# Patient Record
Sex: Female | Born: 1965 | Race: Black or African American | Hispanic: No | Marital: Single | Smoking: Never smoker
Health system: Southern US, Community
[De-identification: ages and names within clinical notes are randomized; demographics above are authoritative.]

## PROBLEM LIST (undated history)

## (undated) DIAGNOSIS — K219 Gastro-esophageal reflux disease without esophagitis: Secondary | ICD-10-CM

## (undated) HISTORY — PX: EYE SURGERY: SHX253

---

## 2005-12-07 ENCOUNTER — Ambulatory Visit: Admission: RE | Admit: 2005-12-07 | Discharge: 2005-12-07 | Payer: Self-pay | Admitting: Obstetrics and Gynecology

## 2006-12-11 ENCOUNTER — Ambulatory Visit (HOSPITAL_COMMUNITY): Admission: RE | Admit: 2006-12-11 | Discharge: 2006-12-11 | Payer: Self-pay | Admitting: Family Medicine

## 2009-01-05 ENCOUNTER — Ambulatory Visit (HOSPITAL_COMMUNITY): Admission: RE | Admit: 2009-01-05 | Discharge: 2009-01-05 | Payer: Self-pay | Admitting: Family Medicine

## 2010-01-11 ENCOUNTER — Ambulatory Visit (HOSPITAL_COMMUNITY): Admission: RE | Admit: 2010-01-11 | Discharge: 2010-01-11 | Payer: Self-pay | Admitting: Family Medicine

## 2011-01-17 ENCOUNTER — Other Ambulatory Visit (HOSPITAL_COMMUNITY): Payer: Self-pay | Admitting: Family Medicine

## 2011-01-17 DIAGNOSIS — Z1231 Encounter for screening mammogram for malignant neoplasm of breast: Secondary | ICD-10-CM

## 2011-01-24 ENCOUNTER — Ambulatory Visit (HOSPITAL_COMMUNITY)
Admission: RE | Admit: 2011-01-24 | Discharge: 2011-01-24 | Disposition: A | Discharge status: Home / Self Care | Payer: Self-pay | Source: Ambulatory Visit | Attending: Family Medicine | Admitting: Family Medicine

## 2011-01-24 DIAGNOSIS — Z1231 Encounter for screening mammogram for malignant neoplasm of breast: Secondary | ICD-10-CM

## 2011-12-12 ENCOUNTER — Other Ambulatory Visit (HOSPITAL_COMMUNITY): Payer: Self-pay | Admitting: *Deleted

## 2011-12-12 DIAGNOSIS — Z1231 Encounter for screening mammogram for malignant neoplasm of breast: Secondary | ICD-10-CM

## 2012-01-23 ENCOUNTER — Other Ambulatory Visit (HOSPITAL_COMMUNITY): Payer: Self-pay | Admitting: *Deleted

## 2012-01-23 DIAGNOSIS — Z1231 Encounter for screening mammogram for malignant neoplasm of breast: Secondary | ICD-10-CM

## 2012-01-26 ENCOUNTER — Ambulatory Visit (HOSPITAL_COMMUNITY)
Admission: RE | Admit: 2012-01-26 | Discharge: 2012-01-26 | Disposition: A | Discharge status: Home / Self Care | Payer: Self-pay | Source: Ambulatory Visit | Attending: *Deleted | Admitting: *Deleted

## 2012-01-26 DIAGNOSIS — Z1231 Encounter for screening mammogram for malignant neoplasm of breast: Secondary | ICD-10-CM

## 2012-01-30 ENCOUNTER — Ambulatory Visit (HOSPITAL_COMMUNITY): Payer: Self-pay

## 2012-02-09 ENCOUNTER — Encounter (HOSPITAL_COMMUNITY): Payer: Self-pay

## 2012-12-24 ENCOUNTER — Other Ambulatory Visit (HOSPITAL_COMMUNITY): Payer: Self-pay | Admitting: Nurse Practitioner

## 2012-12-24 DIAGNOSIS — Z139 Encounter for screening, unspecified: Secondary | ICD-10-CM

## 2012-12-31 ENCOUNTER — Encounter (INDEPENDENT_AMBULATORY_CARE_PROVIDER_SITE_OTHER): Payer: Managed Care, Other (non HMO) | Admitting: Ophthalmology

## 2012-12-31 DIAGNOSIS — H43819 Vitreous degeneration, unspecified eye: Secondary | ICD-10-CM

## 2012-12-31 DIAGNOSIS — H33309 Unspecified retinal break, unspecified eye: Secondary | ICD-10-CM

## 2012-12-31 DIAGNOSIS — H2510 Age-related nuclear cataract, unspecified eye: Secondary | ICD-10-CM

## 2013-01-09 ENCOUNTER — Ambulatory Visit (INDEPENDENT_AMBULATORY_CARE_PROVIDER_SITE_OTHER): Payer: Managed Care, Other (non HMO) | Admitting: Ophthalmology

## 2013-01-09 DIAGNOSIS — H33309 Unspecified retinal break, unspecified eye: Secondary | ICD-10-CM

## 2013-01-21 ENCOUNTER — Encounter (INDEPENDENT_AMBULATORY_CARE_PROVIDER_SITE_OTHER): Payer: Managed Care, Other (non HMO) | Admitting: Ophthalmology

## 2013-01-21 DIAGNOSIS — H33309 Unspecified retinal break, unspecified eye: Secondary | ICD-10-CM

## 2013-01-28 ENCOUNTER — Ambulatory Visit (HOSPITAL_COMMUNITY)
Admission: RE | Admit: 2013-01-28 | Discharge: 2013-01-28 | Disposition: A | Discharge status: Home / Self Care | Payer: Managed Care, Other (non HMO) | Source: Ambulatory Visit | Attending: Nurse Practitioner | Admitting: Nurse Practitioner

## 2013-01-28 DIAGNOSIS — Z1231 Encounter for screening mammogram for malignant neoplasm of breast: Secondary | ICD-10-CM | POA: Insufficient documentation

## 2013-01-28 DIAGNOSIS — Z139 Encounter for screening, unspecified: Secondary | ICD-10-CM

## 2013-03-04 ENCOUNTER — Emergency Department (HOSPITAL_COMMUNITY): Payer: Managed Care, Other (non HMO)

## 2013-03-04 ENCOUNTER — Emergency Department (HOSPITAL_COMMUNITY)
Admission: EM | Admit: 2013-03-04 | Discharge: 2013-03-04 | Disposition: A | Discharge status: Home / Self Care | Payer: Managed Care, Other (non HMO) | Attending: Emergency Medicine | Admitting: Emergency Medicine

## 2013-03-04 ENCOUNTER — Encounter (HOSPITAL_COMMUNITY): Payer: Self-pay | Admitting: Emergency Medicine

## 2013-03-04 DIAGNOSIS — Z3202 Encounter for pregnancy test, result negative: Secondary | ICD-10-CM | POA: Insufficient documentation

## 2013-03-04 DIAGNOSIS — M62838 Other muscle spasm: Secondary | ICD-10-CM | POA: Insufficient documentation

## 2013-03-04 DIAGNOSIS — R1011 Right upper quadrant pain: Secondary | ICD-10-CM | POA: Insufficient documentation

## 2013-03-04 DIAGNOSIS — R071 Chest pain on breathing: Secondary | ICD-10-CM | POA: Insufficient documentation

## 2013-03-04 DIAGNOSIS — R109 Unspecified abdominal pain: Secondary | ICD-10-CM | POA: Insufficient documentation

## 2013-03-04 LAB — COMPREHENSIVE METABOLIC PANEL
ALT: 10 U/L (ref 0–35)
AST: 14 U/L (ref 0–37)
Albumin: 3.7 g/dL (ref 3.5–5.2)
Alkaline Phosphatase: 79 U/L (ref 39–117)
BILIRUBIN TOTAL: 0.2 mg/dL — AB (ref 0.3–1.2)
BUN: 9 mg/dL (ref 6–23)
CALCIUM: 9.3 mg/dL (ref 8.4–10.5)
CHLORIDE: 102 meq/L (ref 96–112)
CO2: 24 meq/L (ref 19–32)
CREATININE: 0.69 mg/dL (ref 0.50–1.10)
GLUCOSE: 80 mg/dL (ref 70–99)
Potassium: 3.8 mEq/L (ref 3.7–5.3)
Sodium: 139 mEq/L (ref 137–147)
Total Protein: 7.9 g/dL (ref 6.0–8.3)

## 2013-03-04 LAB — URINALYSIS, ROUTINE W REFLEX MICROSCOPIC
Bilirubin Urine: NEGATIVE
GLUCOSE, UA: NEGATIVE mg/dL
HGB URINE DIPSTICK: NEGATIVE
Ketones, ur: NEGATIVE mg/dL
LEUKOCYTES UA: NEGATIVE
Nitrite: NEGATIVE
PH: 6 (ref 5.0–8.0)
PROTEIN: NEGATIVE mg/dL
Specific Gravity, Urine: 1.01 (ref 1.005–1.030)
Urobilinogen, UA: 0.2 mg/dL (ref 0.0–1.0)

## 2013-03-04 LAB — CBC WITH DIFFERENTIAL/PLATELET
BASOS ABS: 0 10*3/uL (ref 0.0–0.1)
Basophils Relative: 0 % (ref 0–1)
EOS PCT: 0 % (ref 0–5)
Eosinophils Absolute: 0 10*3/uL (ref 0.0–0.7)
HEMATOCRIT: 38.5 % (ref 36.0–46.0)
HEMOGLOBIN: 12.5 g/dL (ref 12.0–15.0)
LYMPHS ABS: 2.2 10*3/uL (ref 0.7–4.0)
LYMPHS PCT: 22 % (ref 12–46)
MCH: 31.1 pg (ref 26.0–34.0)
MCHC: 32.5 g/dL (ref 30.0–36.0)
MCV: 95.8 fL (ref 78.0–100.0)
MONO ABS: 0.5 10*3/uL (ref 0.1–1.0)
MONOS PCT: 5 % (ref 3–12)
NEUTROS ABS: 7.3 10*3/uL (ref 1.7–7.7)
Neutrophils Relative %: 72 % (ref 43–77)
Platelets: 319 10*3/uL (ref 150–400)
RBC: 4.02 MIL/uL (ref 3.87–5.11)
RDW: 11.8 % (ref 11.5–15.5)
WBC: 10.1 10*3/uL (ref 4.0–10.5)

## 2013-03-04 LAB — PREGNANCY, URINE: PREG TEST UR: NEGATIVE

## 2013-03-04 LAB — LIPASE, BLOOD: LIPASE: 26 U/L (ref 11–59)

## 2013-03-04 MED ORDER — CYCLOBENZAPRINE HCL 5 MG PO TABS: 5.0000 mg | ORAL_TABLET | Freq: Three times a day (TID) | ORAL | Status: DC | PRN

## 2013-03-04 MED ORDER — HYDROCODONE-ACETAMINOPHEN 5-325 MG PO TABS: 1.0000 | ORAL_TABLET | ORAL | Status: DC | PRN

## 2013-03-04 MED ORDER — IBUPROFEN 800 MG PO TABS: 800.0000 mg | ORAL_TABLET | Freq: Three times a day (TID) | ORAL | Status: DC | PRN

## 2013-03-04 MED ORDER — KETOROLAC TROMETHAMINE 30 MG/ML IJ SOLN
30.0000 mg | Freq: Once | INTRAMUSCULAR | Status: AC
  Administered 2013-03-04: 30 mg via INTRAVENOUS
  Filled 2013-03-04: qty 1

## 2013-03-04 NOTE — Discharge Instructions (Signed)
Flank Pain Flank pain refers to pain that is located on the side of the body between the upper abdomen and the back. The pain may occur over a short period of time (acute) or may be long-term or reoccurring (chronic). It may be mild or severe. Flank pain can be caused by many things. CAUSES  Some of the more common causes of flank pain include:  Muscle strains.   Muscle spasms.   A disease of your spine (vertebral disk disease).   A lung infection (pneumonia).   Fluid around your lungs (pulmonary edema).   A kidney infection.   Kidney stones.   A very painful skin rash caused by the chickenpox virus (shingles).   Gallbladder disease.  [place] care will depend on the cause of your pain. In general,  Rest as directed by your caregiver.  Drink enough fluids to keep your urine clear or pale yellow.  Only take over-the-counter or prescription medicines as directed by your caregiver. Some medicines may help relieve the pain.  Tell your caregiver about any changes in your pain.  Follow up with your caregiver as directed. SEEK IMMEDIATE MEDICAL CARE IF:   Your pain is not controlled with medicine.   You have new or worsening symptoms.  Your pain increases.   You have abdominal pain.   You have shortness of breath.   You have persistent nausea or vomiting.   You have swelling in your abdomen.   You feel faint or pass out.   You have blood in your urine.  You have a fever or persistent symptoms for more than 2 3 days.  You have a fever and your symptoms suddenly get worse. MAKE SURE YOU:   Understand these instructions.  Will watch your condition.  Will get help right away if you are not doing well or get worse. Document Released: 03/24/2005 Document Revised: 10/26/2011 Document Reviewed: 09/15/2011 [facility] Patient Information 2014 [place].    Muscle Cramps and Spasms Muscle cramps and spasms occur when a muscle  or muscles tighten and you have no control over this tightening (involuntary muscle contraction). They are a common problem and can develop in any muscle. The most common place is in the calf muscles of the leg. Both muscle cramps and muscle spasms are involuntary muscle contractions, but they also have differences:   Muscle cramps are sporadic and painful. They may last a few seconds to a quarter of an hour. Muscle cramps are often more forceful and last longer than muscle spasms.  Muscle spasms may or may not be painful. They may also last just a few seconds or much longer. CAUSES  It is uncommon for cramps or spasms to be due to a serious underlying problem. In many cases, the cause of cramps or spasms is unknown. Some common causes are:   Overexertion.   Overuse from repetitive motions (doing the same thing over and over).   Remaining in a certain position for a long period of time.   Improper preparation, form, or technique while performing a sport or activity.   Dehydration.   Injury.   Side effects of some medicines.   Abnormally low levels of the salts and ions in your blood (electrolytes), especially potassium and calcium. This could happen if you are taking water pills (diuretics) or you are pregnant.  Some underlying medical problems can make it more likely to develop cramps or spasms. These include, but are not limited to:   Diabetes.  Parkinson disease.   Hormone disorders, such as thyroid problems.   Alcohol abuse.   Diseases specific to muscles, joints, and bones.   Blood vessel disease where not enough blood is getting to the muscles.  HOME CARE INSTRUCTIONS   Stay well hydrated. Drink enough water and fluids to keep your urine clear or pale yellow.  It may be helpful to massage, stretch, and relax the affected muscle.  For tight or tense muscles, use a warm towel, heating pad, or hot shower water directed to the affected area.  If you are  sore or have pain after a cramp or spasm, applying ice to the affected area may relieve discomfort.  Put ice in a plastic bag.  Place a towel between your skin and the bag.  Leave the ice on for 15-20 minutes, 03-04 times a day.  Medicines used to treat a known cause of cramps or spasms may help reduce their frequency or severity. Only take over-the-counter or prescription medicines as directed by your caregiver. SEEK MEDICAL CARE IF:  Your cramps or spasms get more severe, more frequent, or do not improve over time.  MAKE SURE YOU:   Understand these instructions.  Will watch your condition.  Will get help right away if you are not doing well or get worse. Document Released: 07/23/2001 Document Revised: 05/28/2012 Document Reviewed: 01/18/2012 [facility] Patient Information 2014 [place], [place].    Your labs today were normal. Your urine showed no sign of infection or blood to suggest kidney stone. Your chest x-ray showed no pneumonia or fluid. You have no risk factors for blood clots in your lungs. Your ultrasound showed a normal gallbladder and liver without stones or signs of infection. There is no rash in your skin to suggest other causes such as shingles. This may be musculoskeletal strain, spasm. We recommend that you take anti-inflammatories such as ibuprofen as needed for pain. If this is not enough, you may take Vicodin. If you develop any worsening symptoms, shortness of breath, vomiting and cannot keep down fluids, fever, any other symptoms concerning to you, please return to the emergency department.

## 2013-03-04 NOTE — ED Provider Notes (Signed)
TIME SEEN: 11:56 AM   CHIEF COMPLAINT: flank pain  HPI: HPI Comments: Lori Ward is a 48 y.o. female with no significant past medical history who presents to the Emergency Department complaining of sudden onset sharp right sided flank pain that started this morning at 10 am while washing clothes in her laundry room.  No aggravating or alleviating factors. No radiation. She describes the pain as moderate. She has not had any prior occurences or injuries to her back. Pt denies dysuria, hematuria, nausea, vomiting, diarrhea, numbness or tingling, weakness in extremities, bowel or bladder incontinence, fever, chills, or vaginal discharge or bleeding. No prior history of PE or DVT, recent prolonged immobilization such as long flight or hospitalization, recent fracture, surgery, trauma, exogenous hormone use or tobacco use. Pt's LNMP was 3 weeks ago.   ROS: See HPI Constitutional: no fever  Eyes: no drainage  ENT: no runny nose   Cardiovascular:  no chest pain  Resp: no SOB  GI: no vomiting GU: no dysuria Integumentary: no rash  Allergy: no hives  Musculoskeletal: no leg swelling  Neurological: no slurred speech ROS otherwise negative  PAST MEDICAL HISTORY/PAST SURGICAL HISTORY:  History reviewed. No pertinent past medical history.  MEDICATIONS:  Prior to Admission medications   Not on File    ALLERGIES:  No Known Allergies  SOCIAL HISTORY:  History  Substance Use Topics  . Smoking status: Never Smoker   . Smokeless tobacco: Not on file  . Alcohol Use: No    FAMILY HISTORY: No family history on file.  EXAM: BP 126/70  Pulse 78  Temp(Src) 97.2 F (36.2 C) (Oral)  Resp 20  Ht 5\' 7"  (1.702 m)  Wt 170 lb (77.111 kg)  BMI 26.62 kg/m2  SpO2 100%  LMP 02/11/2013 CONSTITUTIONAL: Alert and oriented and responds appropriately to questions. Well-appearing; well-nourished HEAD: Normocephalic EYES: Conjunctivae clear, PERRL ENT: normal nose; no rhinorrhea; moist mucous  membranes; pharynx without lesions noted NECK: Supple, no meningismus, no LAD  CARD: RRR; S1 and S2 appreciated; no murmurs, no clicks, no rubs, no gallops RESP: Normal chest excursion without splinting or tachypnea; breath sounds clear and equal bilaterally; no wheezes, no rhonchi, no rales, mild tenderness to palpation over the right lateral chest wall without skin changes or crepitus or ecchymosis or bony deformity ABD/GI: Normal bowel sounds; non-distended; soft, tender to palpation in RUQ but negative Murphy's sign, no rebound, no guarding BACK:  The back appears normal and is non-tender to palpation, there is no CVA tenderness EXT: Normal ROM in all joints; non-tender to palpation; no edema; normal capillary refill; no cyanosis    SKIN: Normal color for age and race; warm NEURO: Moves all extremities equally PSYCH: The patient's mood and manner are appropriate. Grooming and personal hygiene are appropriate.  MEDICAL DECISION MAKING: Patient here with right-sided flank pain. She is having some reproducible pain over the right chest wall but is also very tender to palpation in her right upper quadrant but has a negative Murphy sign. Given sudden onset, concern for possible kidney stone as well as cholecystitis versus cholelithiasis. Given patient has no risk factors for pulmonary embolus, I do not feel this is likely. She is also describing mild cough. Will obtain labs, urine to evaluate for possible pyelonephritis and kidney stone, right upper quad ultrasound to evaluate patient's gallbladder, chest x-ray to evaluate for possible pneumonia. Will give Toradol and reassess.  ED PROGRESS: Pt reports feeling much better after Toradol. Patient's labs, urine, abdominal ultrasound and  chest x-ray are unremarkable. Suspect her symptoms may be related to musculoskeletal pain. No concern for any life-threatening illness at this time. We'll discharge home with pain medication, anti-inflammatories, muscle  relaxers. Given strict return precautions. Patient has PCP for followup. Patient verbalizes understanding is comfortable with plan.     [place], DO 03/04/13 1427

## 2013-03-04 NOTE — ED Notes (Signed)
Pt reports right flank pain for 1 hour pta, denies any fever, chills, nausea or vomiting, no fever, no vaginal discharge, no urinary s/s

## 2013-06-03 ENCOUNTER — Ambulatory Visit (INDEPENDENT_AMBULATORY_CARE_PROVIDER_SITE_OTHER): Payer: Managed Care, Other (non HMO) | Admitting: Ophthalmology

## 2013-06-03 DIAGNOSIS — H33309 Unspecified retinal break, unspecified eye: Secondary | ICD-10-CM

## 2013-06-03 DIAGNOSIS — H33009 Unspecified retinal detachment with retinal break, unspecified eye: Secondary | ICD-10-CM

## 2013-06-03 DIAGNOSIS — H2510 Age-related nuclear cataract, unspecified eye: Secondary | ICD-10-CM

## 2013-06-03 DIAGNOSIS — H31009 Unspecified chorioretinal scars, unspecified eye: Secondary | ICD-10-CM

## 2013-06-03 DIAGNOSIS — H43819 Vitreous degeneration, unspecified eye: Secondary | ICD-10-CM

## 2013-12-23 ENCOUNTER — Other Ambulatory Visit (HOSPITAL_COMMUNITY): Payer: Self-pay | Admitting: *Deleted

## 2013-12-23 DIAGNOSIS — Z1231 Encounter for screening mammogram for malignant neoplasm of breast: Secondary | ICD-10-CM

## 2014-02-03 ENCOUNTER — Ambulatory Visit (HOSPITAL_COMMUNITY)
Admission: RE | Admit: 2014-02-03 | Discharge: 2014-02-03 | Disposition: A | Discharge status: Home / Self Care | Payer: Managed Care, Other (non HMO) | Source: Ambulatory Visit | Attending: *Deleted | Admitting: *Deleted

## 2014-02-03 DIAGNOSIS — Z1231 Encounter for screening mammogram for malignant neoplasm of breast: Secondary | ICD-10-CM | POA: Insufficient documentation

## 2015-03-16 ENCOUNTER — Other Ambulatory Visit (HOSPITAL_COMMUNITY): Payer: Self-pay | Admitting: *Deleted

## 2015-03-16 DIAGNOSIS — Z1231 Encounter for screening mammogram for malignant neoplasm of breast: Secondary | ICD-10-CM

## 2015-03-20 ENCOUNTER — Ambulatory Visit (HOSPITAL_COMMUNITY)
Admission: RE | Admit: 2015-03-20 | Discharge: 2015-03-20 | Disposition: A | Discharge status: Home / Self Care | Payer: Managed Care, Other (non HMO) | Source: Ambulatory Visit | Attending: *Deleted | Admitting: *Deleted

## 2015-03-20 DIAGNOSIS — Z1231 Encounter for screening mammogram for malignant neoplasm of breast: Secondary | ICD-10-CM | POA: Diagnosis not present

## 2016-03-04 ENCOUNTER — Other Ambulatory Visit (HOSPITAL_COMMUNITY): Payer: Self-pay | Admitting: *Deleted

## 2016-03-04 DIAGNOSIS — Z1231 Encounter for screening mammogram for malignant neoplasm of breast: Secondary | ICD-10-CM

## 2016-03-21 ENCOUNTER — Ambulatory Visit (HOSPITAL_COMMUNITY): Payer: Managed Care, Other (non HMO)

## 2016-03-28 ENCOUNTER — Ambulatory Visit (HOSPITAL_COMMUNITY)
Admission: RE | Admit: 2016-03-28 | Discharge: 2016-03-28 | Disposition: A | Discharge status: Home / Self Care | Payer: Managed Care, Other (non HMO) | Source: Ambulatory Visit | Attending: *Deleted | Admitting: *Deleted

## 2016-03-28 DIAGNOSIS — Z1231 Encounter for screening mammogram for malignant neoplasm of breast: Secondary | ICD-10-CM | POA: Diagnosis not present

## 2017-03-21 ENCOUNTER — Other Ambulatory Visit (HOSPITAL_COMMUNITY): Payer: Self-pay | Admitting: Nurse Practitioner

## 2017-03-21 DIAGNOSIS — Z1231 Encounter for screening mammogram for malignant neoplasm of breast: Secondary | ICD-10-CM

## 2017-04-03 ENCOUNTER — Encounter (HOSPITAL_COMMUNITY): Payer: Self-pay

## 2017-04-03 ENCOUNTER — Ambulatory Visit (HOSPITAL_COMMUNITY)
Admission: RE | Admit: 2017-04-03 | Discharge: 2017-04-03 | Disposition: A | Discharge status: Home / Self Care | Payer: Managed Care, Other (non HMO) | Source: Ambulatory Visit | Attending: Nurse Practitioner | Admitting: Nurse Practitioner

## 2017-04-03 DIAGNOSIS — Z1231 Encounter for screening mammogram for malignant neoplasm of breast: Secondary | ICD-10-CM | POA: Diagnosis not present

## 2017-04-11 ENCOUNTER — Encounter (INDEPENDENT_AMBULATORY_CARE_PROVIDER_SITE_OTHER): Payer: Self-pay | Admitting: *Deleted

## 2018-02-26 ENCOUNTER — Other Ambulatory Visit (HOSPITAL_COMMUNITY): Payer: Self-pay | Admitting: Nurse Practitioner

## 2018-02-26 DIAGNOSIS — Z1231 Encounter for screening mammogram for malignant neoplasm of breast: Secondary | ICD-10-CM

## 2018-04-09 ENCOUNTER — Ambulatory Visit (HOSPITAL_COMMUNITY)
Admission: RE | Admit: 2018-04-09 | Discharge: 2018-04-09 | Disposition: A | Discharge status: Home / Self Care | Payer: BLUE CROSS/BLUE SHIELD | Source: Ambulatory Visit | Attending: Nurse Practitioner | Admitting: Nurse Practitioner

## 2018-04-09 DIAGNOSIS — Z1231 Encounter for screening mammogram for malignant neoplasm of breast: Secondary | ICD-10-CM | POA: Diagnosis not present

## 2018-07-07 ENCOUNTER — Other Ambulatory Visit: Payer: Self-pay

## 2018-07-07 ENCOUNTER — Encounter (HOSPITAL_COMMUNITY): Payer: Self-pay | Admitting: Emergency Medicine

## 2018-07-07 ENCOUNTER — Emergency Department (HOSPITAL_COMMUNITY)
Admission: EM | Admit: 2018-07-07 | Discharge: 2018-07-08 | Disposition: A | Discharge status: Home / Self Care | Payer: BLUE CROSS/BLUE SHIELD | Attending: Emergency Medicine | Admitting: Emergency Medicine

## 2018-07-07 DIAGNOSIS — S40862A Insect bite (nonvenomous) of left upper arm, initial encounter: Secondary | ICD-10-CM | POA: Diagnosis present

## 2018-07-07 DIAGNOSIS — T7840XA Allergy, unspecified, initial encounter: Secondary | ICD-10-CM

## 2018-07-07 DIAGNOSIS — W57XXXA Bitten or stung by nonvenomous insect and other nonvenomous arthropods, initial encounter: Secondary | ICD-10-CM | POA: Insufficient documentation

## 2018-07-07 DIAGNOSIS — Y9289 Other specified places as the place of occurrence of the external cause: Secondary | ICD-10-CM | POA: Diagnosis not present

## 2018-07-07 DIAGNOSIS — Y9389 Activity, other specified: Secondary | ICD-10-CM | POA: Diagnosis not present

## 2018-07-07 DIAGNOSIS — Y998 Other external cause status: Secondary | ICD-10-CM | POA: Insufficient documentation

## 2018-07-07 MED ORDER — FAMOTIDINE 20 MG PO TABS
20.0000 mg | ORAL_TABLET | Freq: Once | ORAL | Status: AC
  Administered 2018-07-08: 20 mg via ORAL
  Filled 2018-07-07: qty 1

## 2018-07-07 MED ORDER — PREDNISONE 20 MG PO TABS
40.0000 mg | ORAL_TABLET | Freq: Once | ORAL | Status: AC
  Administered 2018-07-08: 40 mg via ORAL
  Filled 2018-07-07: qty 2

## 2018-07-07 MED ORDER — DEXAMETHASONE 4 MG PO TABS: 4.0000 mg | ORAL_TABLET | Freq: Two times a day (BID) | ORAL | 0 refills | Status: DC

## 2018-07-07 MED ORDER — LORATADINE 10 MG PO TABS
10.0000 mg | ORAL_TABLET | Freq: Once | ORAL | Status: AC
  Administered 2018-07-08: 10 mg via ORAL
  Filled 2018-07-07: qty 1

## 2018-07-07 NOTE — Discharge Instructions (Addendum)
Please use Claritin and Pepcid 20 mg each morning.  Please use Benadryl and Pepcid 20 mg each evening at bedtime.  Use Decadron 2 times daily with food until all taken.  Please see your primary physician or return to the emergency department if any difficulty with breathing, wheezing, swelling of the mouth or throat, worsening of your condition, worsening of symptoms, problems, or concerns.

## 2018-07-07 NOTE — ED Triage Notes (Signed)
Pt states she "saw something fly past me and all of a sudden I felt my left arm start itching." Pt c/o itching and "welps" all over. Denies SOB.

## 2018-07-07 NOTE — ED Provider Notes (Signed)
[facility] EMERGENCY DEPARTMENT Provider Note   CSN: [id number] Arrival date & time: 07/07/18  2251    History   Chief Complaint Chief Complaint  Patient presents with  . Insect Bite    HPI Lori Ward is a 53 y.o. female.     Patient is a 53 year old female who presents to the emergency department with a complaint of insect bite and itching all over.  The patient states that earlier tonight she saw something fly over her, and then she felt a bite on her left upper arm.  She says that it looked as though it was swelling and turning gray.  She shortly after that began to notice a whelp on her arm.  She says she then freaked out because she was itching from her lower abdomen up to her neck.  She noted some hives on her arms, and then she noted some hives on the inner aspect of her knees and thigh.  There was no shortness of breath.  There is no difficulty with breathing.  There was no loss of consciousness.  Patient denies any facial swelling.  No difficulty with swallowing or speaking.  She presents now for assistance and for evaluation following the insect bite.  The history is provided by the patient.    History reviewed. No pertinent past medical history.  There are no active problems to display for this patient.   Past Surgical History:  Procedure Laterality Date  . EYE SURGERY       OB History   No obstetric history on file.      Home Medications    Prior to Admission medications   Medication Sig Start Date End Date Taking? Authorizing Provider  cyclobenzaprine (FLEXERIL) 5 MG tablet Take 1 tablet (5 mg total) by mouth 3 (three) times daily as needed for muscle spasms. 03/04/13   Ward, Delice Bison, DO  HYDROcodone-acetaminophen (NORCO/VICODIN) 5-325 MG per tablet Take 1 tablet by mouth every 4 (four) hours as needed. 03/04/13   Ward, Delice Bison, DO  ibuprofen (ADVIL,MOTRIN) 800 MG tablet Take 1 tablet (800 mg total) by mouth every 8 (eight) hours as needed for mild  pain. 03/04/13   Ward, Delice Bison, DO    Family History No family history on file.  Social History Social History   Tobacco Use  . Smoking status: Never Smoker  . Smokeless tobacco: Never Used  Substance Use Topics  . Alcohol use: No  . Drug use: No     Allergies   Bee venom   Review of Systems Review of Systems  Constitutional: Negative for activity change.       All ROS Neg except as noted in HPI  HENT: Negative for nosebleeds.   Eyes: Negative for photophobia and discharge.  Respiratory: Negative for cough, shortness of breath and wheezing.   Cardiovascular: Negative for chest pain and palpitations.  Gastrointestinal: Negative for abdominal pain and blood in stool.  Genitourinary: Negative for dysuria, frequency and hematuria.  Musculoskeletal: Negative for arthralgias, back pain and neck pain.  Skin: Negative.   Neurological: Negative for dizziness, seizures and speech difficulty.  Psychiatric/Behavioral: Negative for confusion and hallucinations.     Physical Exam Updated Vital Signs BP (!) 140/99 (BP Location: Right Arm)   Pulse 91   Temp 98.1 F (36.7 C) (Oral)   Resp 18   Ht 5\' 7"  (1.702 m)   Wt 86.2 kg   LMP 06/22/2018   SpO2 98%   BMI 29.76 kg/m  Physical Exam Vitals signs and nursing note reviewed.  Constitutional:      Appearance: She is well-developed. She is not toxic-appearing.  HENT:     Head: Normocephalic.     Right Ear: Tympanic membrane and external ear normal.     Left Ear: Tympanic membrane and external ear normal.     Mouth/Throat:     Comments: Airway is patent.  Uvula is in the midline.  There is no swelling of the tongue.  There is no swelling under the tongue.  Speech is clear. Eyes:     General: Lids are normal.     Pupils: Pupils are equal, round, and reactive to light.  Neck:     Musculoskeletal: Normal range of motion and neck supple.     Vascular: No carotid bruit.  Cardiovascular:     Rate and Rhythm: Normal rate  and regular rhythm.     Pulses: Normal pulses.     Heart sounds: Normal heart sounds.  Pulmonary:     Effort: No respiratory distress.     Breath sounds: Normal breath sounds.     Comments: There is no wheezing.  There is no use of accessory muscles.  There is symmetrical rise and fall of the chest.  The patient speaks in complete sentences without problem. Abdominal:     General: Bowel sounds are normal.     Palpations: Abdomen is soft.     Tenderness: There is no abdominal tenderness. There is no guarding.  Musculoskeletal: Normal range of motion.  Lymphadenopathy:     Head:     Right side of head: No submandibular adenopathy.     Left side of head: No submandibular adenopathy.     Cervical: No cervical adenopathy.  Skin:    General: Skin is warm and dry.     Comments: No hives appreciated on the back, the neck, the upper or lower extremities.  There are areas of increased redness of the upper arms, and the forearms on the right and left.  There are also some increased reddened areas on the inner aspect of the thigh and the knee area.  Neurological:     Mental Status: She is alert and oriented to person, place, and time.     Cranial Nerves: No cranial nerve deficit.     Sensory: No sensory deficit.  Psychiatric:        Speech: Speech normal.      ED Treatments / Results  Labs (all labs ordered are listed, but only abnormal results are displayed) Labs Reviewed - No data to display  EKG None  Radiology No results found.  Procedures Procedures (including critical care time)  Medications Ordered in ED Medications - No data to display   Initial Impression / Assessment and Plan / ED Course  I have reviewed the triage vital signs and the nursing notes.  Pertinent labs & imaging results that were available during my care of the patient were reviewed by me and considered in my medical decision making (see chart for details).          Final Clinical Impressions(s) /  ED Diagnoses MDM  There are no hives noted at the time of examination.  The vital signs are within normal limits.  The pulse oximetry is 98% on room air.  Within normal limits by my interpretation.  The patient speaks in complete sentences without problem.  There is no facial swelling.  There is no swelling of the oropharynx.  The patient  is driving.  The patient is treated in the emergency department with Claritin, Pepcid, and prednisone.  Prescription for Decadron is given.  The patient is asked to use Claritin and Pepcid each morning, Benadryl and Pepcid each evening at bedtime.  The patient is advised to see the primary physician or return to the emergency department if any changes in her condition, worsening of her symptoms, problems, or concerns.  Patient is in agreement with this plan.   Final diagnoses:  Allergic reaction, initial encounter  Insect bite of left upper extremity, initial encounter    ED Discharge Orders         Ordered    dexamethasone (DECADRON) 4 MG tablet  2 times daily with meals     07/07/18 2354           Lily Kocher, PA-C 15/94/58 5929    Delora Fuel, MD 24/46/28 [phone]

## 2018-07-08 NOTE — ED Notes (Signed)
Pt ambulatory to waiting room. Pt verbalized understanding of discharge instructions.   

## 2019-04-15 ENCOUNTER — Other Ambulatory Visit (HOSPITAL_COMMUNITY): Payer: Self-pay | Admitting: Nurse Practitioner

## 2019-04-15 DIAGNOSIS — Z1231 Encounter for screening mammogram for malignant neoplasm of breast: Secondary | ICD-10-CM

## 2019-04-24 ENCOUNTER — Encounter: Payer: Self-pay | Admitting: *Deleted

## 2019-04-29 ENCOUNTER — Ambulatory Visit (HOSPITAL_COMMUNITY)
Admission: RE | Admit: 2019-04-29 | Discharge: 2019-04-29 | Disposition: A | Discharge status: Home / Self Care | Payer: BC Managed Care – PPO | Source: Ambulatory Visit | Attending: Nurse Practitioner | Admitting: Nurse Practitioner

## 2019-04-29 ENCOUNTER — Other Ambulatory Visit: Payer: Self-pay

## 2019-04-29 DIAGNOSIS — Z1231 Encounter for screening mammogram for malignant neoplasm of breast: Secondary | ICD-10-CM | POA: Diagnosis not present

## 2019-06-13 ENCOUNTER — Other Ambulatory Visit: Payer: Self-pay

## 2019-06-13 ENCOUNTER — Ambulatory Visit (INDEPENDENT_AMBULATORY_CARE_PROVIDER_SITE_OTHER): Payer: Self-pay | Admitting: *Deleted

## 2019-06-13 DIAGNOSIS — Z1211 Encounter for screening for malignant neoplasm of colon: Secondary | ICD-10-CM

## 2019-06-13 NOTE — Progress Notes (Signed)
Called pt and triaged her for procedure.  Pt informed me upon scheduling her procedure that a Monday would work best for her.  She was made aware that [facility] only has WED and FRI procedures available right now.  Pt made aware that we will have Dr. Abbey Chatters joining [place] in Aug.  Pt said that she would like to wait and be scheduled for a Monday in Aug/Sept.

## 2019-06-13 NOTE — Progress Notes (Signed)
Gastroenterology Pre-Procedure Review  Request Date: 06/13/2019 Requesting Physician: Bryan Lemma, NP, no previous TCS  PATIENT REVIEW QUESTIONS: The patient responded to the following health history questions as indicated:    1. Diabetes Melitis: no 2. Joint replacements in the past 12 months: no 3. Major health problems in the past 3 months: no 4. Has an artificial valve or MVP: no 5. Has a defibrillator: no 6. Has been advised in past to take antibiotics in advance of a procedure like teeth cleaning: no 7. Family history of colon cancer: no  8. Alcohol Use: no 9. Illicit drug Use: no 10. History of sleep apnea: no  11. History of coronary artery or other vascular stents placed within the last 12 months: no 12. History of any prior anesthesia complications: no 13. There is no height or weight on file to calculate BMI. ht: 5'7 wt: 189 lbs    MEDICATIONS & ALLERGIES:    Patient reports the following regarding taking any blood thinners:   Plavix? no Aspirin? no Coumadin? no Brilinta? no Xarelto? no Eliquis? no Pradaxa? no Savaysa? no Effient? no  Patient confirms/reports the following medications:  Current Outpatient Medications  Medication Sig Dispense Refill  . Multiple Vitamins-Minerals (CENTRUM ULTRA WOMENS) TABS Take by mouth daily.    . naproxen sodium (ALEVE) 220 MG tablet Take 220 mg by mouth as needed.     No current facility-administered medications for this visit.    Patient confirms/reports the following allergies:  Allergies  Allergen Reactions  . Bee Venom Swelling    "Stomach swells up."    No orders of the defined types were placed in this encounter.   AUTHORIZATION INFORMATION Primary Insurance: [place],  [place] #[id number],  Group #: [id number] Pre-Cert / Josem Kaufmann required: No, submit to local [employer]  SCHEDULE INFORMATION: Procedure has been scheduled as follows:  Date: , Time:   Location:  This Gastroenterology Pre-Precedure Review Form is being  routed to the following provider(s): Walden Field, NP

## 2019-06-17 NOTE — Progress Notes (Signed)
Noted. Ok to schedule in August with Dr. Abbey Chatters

## 2019-08-30 ENCOUNTER — Telehealth: Payer: Self-pay | Admitting: *Deleted

## 2019-08-30 NOTE — Telephone Encounter (Signed)
Tried to call pt to schedule procedure.  Had to lmom.

## 2019-09-02 NOTE — Telephone Encounter (Signed)
Lmom for pt to call me back.  Need to schedule procedure.

## 2019-09-03 ENCOUNTER — Encounter: Payer: Self-pay | Admitting: *Deleted

## 2019-09-03 NOTE — Telephone Encounter (Signed)
Left another message for pt to call us back to schedule procedure.  Will mail out letter for pt to contact our office.

## 2019-12-11 ENCOUNTER — Telehealth: Payer: Self-pay | Admitting: *Deleted

## 2019-12-11 NOTE — Telephone Encounter (Signed)
Noted  

## 2019-12-11 NOTE — Telephone Encounter (Signed)
Patient has appt with Dr Olevia Perches office as a new patient.

## 2019-12-11 NOTE — Progress Notes (Signed)
Multiple attempts were made by phone to reach pt back in July.  Letter was mailed to pt on 09/03/2019.  No response from pt as of yet.  Pt will need another nurse visit if she call back.

## 2019-12-11 NOTE — Telephone Encounter (Signed)
Multiple attempts were made by phone to reach pt back in July.  Letter was mailed to pt on 09/03/2019.  No response from pt as of yet.  Pt will need another nurse visit if she call back.

## 2019-12-23 ENCOUNTER — Encounter (INDEPENDENT_AMBULATORY_CARE_PROVIDER_SITE_OTHER): Payer: Self-pay | Admitting: *Deleted

## 2019-12-23 ENCOUNTER — Ambulatory Visit (INDEPENDENT_AMBULATORY_CARE_PROVIDER_SITE_OTHER): Payer: BC Managed Care – PPO | Admitting: Gastroenterology

## 2019-12-23 ENCOUNTER — Encounter (INDEPENDENT_AMBULATORY_CARE_PROVIDER_SITE_OTHER): Payer: Self-pay | Admitting: Gastroenterology

## 2019-12-23 ENCOUNTER — Other Ambulatory Visit: Payer: Self-pay

## 2019-12-23 ENCOUNTER — Telehealth (INDEPENDENT_AMBULATORY_CARE_PROVIDER_SITE_OTHER): Payer: Self-pay | Admitting: *Deleted

## 2019-12-23 ENCOUNTER — Other Ambulatory Visit (INDEPENDENT_AMBULATORY_CARE_PROVIDER_SITE_OTHER): Payer: Self-pay | Admitting: *Deleted

## 2019-12-23 VITALS — BP 125/75 | Temp 99.1°F | Ht 67.0 in | Wt 196.2 lb

## 2019-12-23 DIAGNOSIS — K219 Gastro-esophageal reflux disease without esophagitis: Secondary | ICD-10-CM

## 2019-12-23 DIAGNOSIS — R1310 Dysphagia, unspecified: Secondary | ICD-10-CM

## 2019-12-23 MED ORDER — ESOMEPRAZOLE MAGNESIUM 40 MG PO CPDR: 40.0000 mg | DELAYED_RELEASE_CAPSULE | Freq: Every day | ORAL | 6 refills | Status: DC

## 2019-12-23 NOTE — Telephone Encounter (Signed)
Patient needs [place] (copay card) ° °

## 2019-12-23 NOTE — Progress Notes (Signed)
Patient profile: Lori Ward is a 54 y.o. female seen for evaluation of GERD  History of Present Illness: Lori Ward is seen today for evaluation.  She reports over the past 1 to 2 months developing issues with dysphagia, she first noted this to mainly meats and breads but feels that she started eating a lot slower and dysphagia appears resolved.  She is no longer having dysphagia.  No episodes of regurgitation.  She had burning and belching a few months ago but started Nexium 20 mg once a day and this improved her symptoms.  Now rarely has burning or belching after meals.  She denies any nausea vomiting. No epigastric pain. Weight stable.   She has chronic mild constipation with a bowel movement usually 2-3 times a week.  She denies any abdominal pain, blood in stool, diarrhea.  Denies tobacco.  Very rare alcohol and NSAIDs.  Feels well today without any complaints.  No family history of colon polyps colon cancer.  Wt Readings from Last 3 Encounters:  12/23/19 196 lb 3.2 oz (89 kg)  07/07/18 190 lb (86.2 kg)  03/04/13 170 lb (77.1 kg)     Last Colonoscopy: None prior   Past Medical History:  Denies significant past medical history  Past Surgical History: Past Surgical History:  Procedure Laterality Date  . EYE SURGERY      Allergies: Allergies  Allergen Reactions  . Bee Venom Swelling    "Stomach swells up."      Home Medications:  Current Outpatient Medications:  Marland Kitchen  Multiple Vitamins-Minerals (CENTRUM ULTRA WOMENS) TABS, Take by mouth daily., Disp: , Rfl:  .  naproxen sodium (ALEVE) 220 MG tablet, Take 220 mg by mouth as needed., Disp: , Rfl:  .  esomeprazole (NEXIUM) 40 MG capsule, Take 1 capsule (40 mg total) by mouth daily before breakfast., Disp: 30 capsule, Rfl: 6     Social History:   reports that she has never smoked. She has never used smokeless tobacco. She reports that she does not drink alcohol and does not use drugs.   Review of  Systems: Constitutional: Denies weight loss/weight gain  Eyes: No changes in vision. ENT: No oral lesions, sore throat.  GI: see HPI.  Heme/Lymph: No easy bruising.  CV: No chest pain.  GU: No hematuria.  Integumentary: No rashes.  Neuro: No headaches.  Psych: No depression/anxiety.  Endocrine: No heat/cold intolerance.  Allergic/Immunologic: No urticaria.  Resp: No cough, SOB.  Musculoskeletal: No joint swelling.    Physical Examination: BP 125/75 (BP Location: Right Arm, Patient Position: Sitting, Cuff Size: Normal)   Temp 99.1 F (37.3 C) (Oral)   Ht 5\' 7"  (1.702 m)   Wt 196 lb 3.2 oz (89 kg)   BMI 30.73 kg/m  Gen: NAD, alert and oriented x 4 HEENT: PEERLA, EOMI, Neck: supple, no JVD Chest: CTA bilaterally, no wheezes, crackles, or other adventitious sounds CV: RRR, no m/g/c/r Abd: soft, NT, ND, +BS in all four quadrants; no HSM, guarding, ridigity, or rebound tenderness Ext: no edema, well perfused with 2+ pulses, Skin: no rash or lesions noted on observed skin Lymph: no noted LAD  Data Reviewed:  No recent labs available in epic or Care Everywhere.  Per patient monitored at [facility] and normal   Assessment/Plan: Ms. York is a 54 y.o. female seen for evaluation of GERD and colon cancer screen  1. GERD-symptoms improved but not completely resolved with Nexium 20 mg but she would like to try Nexium 40  mg and prescription sent to pharmacy.  Diet modifications reviewed.  2.  Dysphagia-noted about 2 months ago but she reports this resolved with chewing her foods better.  We discussed upper endoscopy at time of colonoscopy but she declines.  She will contact me if symptoms reoccur.  3.  Colon cancer screening-no prior colonoscopy.  Denies prior issues with sedation.  No lower GI symptoms.  Patient denies CP, SOB, and use of blood thinners. I discussed the risks and benefits of procedure including bleeding, perforation, infection, missed lesions, medication reactions and  possible hospitalization or surgery if complications. All questions answered.    Paz was seen today for heartburn.  Diagnoses and all orders for this visit:  Chronic GERD  Dysphagia, unspecified type  Other orders -     esomeprazole (NEXIUM) 40 MG capsule; Take 1 capsule (40 mg total) by mouth daily before breakfast.        I personally performed the service, non-incident to. (WP)  Laurine Blazer, [facility]

## 2019-12-23 NOTE — Patient Instructions (Signed)
We are scheduling colonoscopy for evaluation.  I sent Nexium 40 mg to your pharmacy.  GERD instructions: -Please avoid lying flat within 2 to 3 hours of eating, this will make reflux symptoms worse. -Some patients find elevating the head of the bed beneficial. -Avoid spicy greasy foods as well as caffeine, coffee, sodas-these food/drinks can worsen heartburn and reflux. -Tobacco will worsen reflux, please try to decrease/eliminate tobacco intake if applicable. -Avoid NSAID products (ibuprofen, aspirin, Advil, Aleve, Goody's, BCs, Alka-Seltzer) - if needing these occasionally please try to take with meal or snack to decrease stomach irritation. -If taking medication for reflux such as prilosec, nexium, aciphex, dexilant, prevacid - take 20-30 minutes before a meal for maximum effectiveness.

## 2019-12-24 MED ORDER — SUTAB 1479-225-188 MG PO TABS: 1.0000 | ORAL_TABLET | Freq: Once | ORAL | 0 refills | Status: AC

## 2020-02-12 ENCOUNTER — Other Ambulatory Visit (INDEPENDENT_AMBULATORY_CARE_PROVIDER_SITE_OTHER): Payer: Self-pay

## 2020-02-12 ENCOUNTER — Other Ambulatory Visit (HOSPITAL_COMMUNITY)
Admission: RE | Admit: 2020-02-12 | Discharge: 2020-02-12 | Disposition: A | Discharge status: Home / Self Care | Payer: BC Managed Care – PPO | Source: Ambulatory Visit | Attending: Internal Medicine | Admitting: Internal Medicine

## 2020-02-12 ENCOUNTER — Other Ambulatory Visit: Payer: Self-pay

## 2020-02-12 DIAGNOSIS — D120 Benign neoplasm of cecum: Secondary | ICD-10-CM | POA: Diagnosis not present

## 2020-02-12 DIAGNOSIS — Z20822 Contact with and (suspected) exposure to covid-19: Secondary | ICD-10-CM | POA: Insufficient documentation

## 2020-02-12 DIAGNOSIS — Z01812 Encounter for preprocedural laboratory examination: Secondary | ICD-10-CM | POA: Insufficient documentation

## 2020-02-12 DIAGNOSIS — K648 Other hemorrhoids: Secondary | ICD-10-CM | POA: Diagnosis not present

## 2020-02-12 DIAGNOSIS — Z1211 Encounter for screening for malignant neoplasm of colon: Secondary | ICD-10-CM

## 2020-02-12 DIAGNOSIS — Z91030 Bee allergy status: Secondary | ICD-10-CM | POA: Diagnosis not present

## 2020-02-12 DIAGNOSIS — D122 Benign neoplasm of ascending colon: Secondary | ICD-10-CM | POA: Diagnosis not present

## 2020-02-12 LAB — SARS CORONAVIRUS 2 (TAT 6-24 HRS): SARS Coronavirus 2: NEGATIVE

## 2020-02-13 ENCOUNTER — Other Ambulatory Visit: Payer: Self-pay

## 2020-02-13 ENCOUNTER — Encounter (HOSPITAL_COMMUNITY)
Admission: RE | Disposition: A | Discharge status: Home / Self Care | Payer: Self-pay | Source: Home / Self Care | Attending: Internal Medicine

## 2020-02-13 ENCOUNTER — Ambulatory Visit (HOSPITAL_COMMUNITY)
Admission: RE | Admit: 2020-02-13 | Discharge: 2020-02-13 | Disposition: A | Discharge status: Home / Self Care | Payer: BC Managed Care – PPO | Attending: Internal Medicine | Admitting: Internal Medicine

## 2020-02-13 ENCOUNTER — Encounter (HOSPITAL_COMMUNITY): Payer: Self-pay | Admitting: Internal Medicine

## 2020-02-13 DIAGNOSIS — K648 Other hemorrhoids: Secondary | ICD-10-CM | POA: Insufficient documentation

## 2020-02-13 DIAGNOSIS — Z1211 Encounter for screening for malignant neoplasm of colon: Secondary | ICD-10-CM | POA: Insufficient documentation

## 2020-02-13 DIAGNOSIS — D120 Benign neoplasm of cecum: Secondary | ICD-10-CM | POA: Diagnosis not present

## 2020-02-13 DIAGNOSIS — D125 Benign neoplasm of sigmoid colon: Secondary | ICD-10-CM | POA: Diagnosis not present

## 2020-02-13 DIAGNOSIS — D122 Benign neoplasm of ascending colon: Secondary | ICD-10-CM | POA: Insufficient documentation

## 2020-02-13 DIAGNOSIS — Z20822 Contact with and (suspected) exposure to covid-19: Secondary | ICD-10-CM | POA: Insufficient documentation

## 2020-02-13 DIAGNOSIS — Z91030 Bee allergy status: Secondary | ICD-10-CM | POA: Insufficient documentation

## 2020-02-13 HISTORY — PX: POLYPECTOMY: SHX5525

## 2020-02-13 HISTORY — DX: Gastro-esophageal reflux disease without esophagitis: K21.9

## 2020-02-13 HISTORY — PX: HOT HEMOSTASIS: SHX5433

## 2020-02-13 HISTORY — PX: COLONOSCOPY: SHX5424

## 2020-02-13 LAB — HM COLONOSCOPY

## 2020-02-13 SURGERY — COLONOSCOPY
Anesthesia: Moderate Sedation

## 2020-02-13 MED ORDER — MEPERIDINE HCL 50 MG/ML IJ SOLN
INTRAMUSCULAR | Status: AC
  Filled 2020-02-13: qty 1

## 2020-02-13 MED ORDER — STERILE WATER FOR IRRIGATION IR SOLN
Status: DC | PRN
  Administered 2020-02-13: 09:00:00 1.5 mL

## 2020-02-13 MED ORDER — SODIUM CHLORIDE 0.9 % IV SOLN
INTRAVENOUS | Status: DC
  Administered 2020-02-13: 08:00:00 1000 mL via INTRAVENOUS

## 2020-02-13 MED ORDER — MIDAZOLAM HCL 5 MG/5ML IJ SOLN
INTRAMUSCULAR | Status: AC
  Filled 2020-02-13: qty 10

## 2020-02-13 MED ORDER — MEPERIDINE HCL 50 MG/ML IJ SOLN
INTRAMUSCULAR | Status: DC | PRN
  Administered 2020-02-13 (×2): 25 mg via INTRAVENOUS

## 2020-02-13 MED ORDER — MIDAZOLAM HCL 5 MG/5ML IJ SOLN
INTRAMUSCULAR | Status: DC | PRN
  Administered 2020-02-13 (×5): 2 mg via INTRAVENOUS

## 2020-02-13 NOTE — Discharge Instructions (Signed)
No aspirin or NSAIDs for 1 week. Resume other medications as before. Resume usual diet. No driving for 24 hours. Physician will call with biopsy results   Colonoscopy, Adult, Care After This sheet gives you information about how to care for yourself after your procedure. Your doctor may also give you more specific instructions. If you have problems or questions, call your doctor. What can I expect after the procedure? After the procedure, it is common to have:  A small amount of blood in your poop (stool) for 24 hours.  Some gas.  Mild cramping or bloating in your belly (abdomen). Follow these instructions at home: Eating and drinking   Drink enough fluid to keep your pee (urine) pale yellow.  Follow instructions from your doctor about what you cannot eat or drink.  Return to your normal diet as told by your doctor. Avoid heavy or fried foods that are hard to digest. Activity  Rest as told by your doctor.  Do not sit for a long time without moving. Get up to take short walks every 1-2 hours. This is important. Ask for help if you feel weak or unsteady.  Return to your normal activities as told by your doctor. Ask your doctor what activities are safe for you. To help cramping and bloating:   Try walking around.  Put heat on your belly as told by your doctor. Use the heat source that your doctor recommends, such as a moist heat pack or a heating pad. ? Put a towel between your skin and the heat source. ? Leave the heat on for 20-30 minutes. ? Remove the heat if your skin turns bright red. This is very important if you are unable to feel pain, heat, or cold. You may have a greater risk of getting burned. General instructions  For the first 24 hours after the procedure: ? Do not drive or use machinery. ? Do not sign important documents. ? Do not drink alcohol. ? Do your daily activities more slowly than normal. ? Eat foods that are soft and easy to digest.  Take  over-the-counter or prescription medicines only as told by your doctor.  Keep all follow-up visits as told by your doctor. This is important. Contact a doctor if:  You have blood in your poop 2-3 days after the procedure. Get help right away if:  You have more than a small amount of blood in your poop.  You see large clumps of tissue (blood clots) in your poop.  Your belly is swollen.  You feel like you may vomit (nauseous).  You vomit.  You have a fever.  You have belly pain that gets worse, and medicine does not help your pain. Summary  After the procedure, it is common to have a small amount of blood in your poop. You may also have mild cramping and bloating in your belly.  For the first 24 hours after the procedure, do not drive or use machinery, do not sign important documents, and do not drink alcohol.  Get help right away if you have a lot of blood in your poop, feel like you may vomit, have a fever, or have more belly pain. This information is not intended to replace advice given to you by your health care provider. Make sure you discuss any questions you have with your health care provider. Document Revised: 08/27/2018 Document Reviewed: 08/27/2018 Elsevier Patient Education  [place].  Colon Polyps  Polyps are tissue growths inside the body. Polyps  can grow in many places, including the large intestine (colon). A polyp may be a round bump or a mushroom-shaped growth. You could have one polyp or several. Most colon polyps are noncancerous (benign). However, some colon polyps can become cancerous over time. Finding and removing the polyps early can help prevent this. What are the causes? The exact cause of colon polyps is not known. What increases the risk? You are more likely to develop this condition if you:  Have a family history of colon cancer or colon polyps.  Are older than 22 or older than 45 if you are African American.  Have inflammatory bowel  disease, such as ulcerative colitis or Crohn's disease.  Have certain hereditary conditions, such as: ? Familial adenomatous polyposis. ? Lynch syndrome. ? Turcot syndrome. ? Peutz-Jeghers syndrome.  Are overweight.  Smoke cigarettes.  Do not get enough exercise.  Drink too much alcohol.  Eat a diet that is high in fat and red meat and low in fiber.  Had childhood cancer that was treated with abdominal radiation. What are the signs or symptoms? Most polyps do not cause symptoms. If you have symptoms, they may include:  Blood coming from your rectum when having a bowel movement.  Blood in your stool. The stool may look dark red or black.  Abdominal pain.  A change in bowel habits, such as constipation or diarrhea. How is this diagnosed? This condition is diagnosed with a colonoscopy. This is a procedure in which a lighted, flexible scope is inserted into the anus and then passed into the colon to examine the area. Polyps are sometimes found when a colonoscopy is done as part of routine cancer screening tests. How is this treated? Treatment for this condition involves removing any polyps that are found. Most polyps can be removed during a colonoscopy. Those polyps will then be tested for cancer. Additional treatment may be needed depending on the results of testing. Follow these instructions at home: Lifestyle  Maintain a healthy weight, or lose weight if recommended by your health care provider.  Exercise every day or as told by your health care provider.  Do not use any products that contain nicotine or tobacco, such as cigarettes and e-cigarettes. If you need help quitting, ask your health care provider.  If you drink alcohol, limit how much you have: ? 0-1 drink a day for women. ? 0-2 drinks a day for men.  Be aware of how much alcohol is in your drink. In the [place] one drink equals one 12 oz bottle of beer (355 mL), one 5 oz glass of wine (148 mL), or one 1 oz shot  of hard liquor (44 mL). Eating and drinking   Eat foods that are high in fiber, such as fruits, vegetables, and whole grains.  Eat foods that are high in calcium and vitamin D, such as milk, cheese, yogurt, eggs, liver, fish, and broccoli.  Limit foods that are high in fat, such as fried foods and desserts.  Limit the amount of red meat and processed meat you eat, such as hot dogs, sausage, bacon, and lunch meats. General instructions  Keep all follow-up visits as told by your health care provider. This is important. ? This includes having regularly scheduled colonoscopies. ? Talk to your health care provider about when you need a colonoscopy. Contact a health care provider if:  You have new or worsening bleeding during a bowel movement.  You have new or increased blood in your stool.  You  have a change in bowel habits.  You lose weight for no known reason. Summary  Polyps are tissue growths inside the body. Polyps can grow in many places, including the colon.  Most colon polyps are noncancerous (benign), but some can become cancerous over time.  This condition is diagnosed with a colonoscopy.  Treatment for this condition involves removing any polyps that are found. Most polyps can be removed during a colonoscopy. This information is not intended to replace advice given to you by your health care provider. Make sure you discuss any questions you have with your health care provider. Document Revised: 05/18/2017 Document Reviewed: 05/18/2017 Elsevier Patient Education  2020 [employer].  Hemorrhoids Hemorrhoids are swollen veins in and around the rectum or anus. There are two types of hemorrhoids:  Internal hemorrhoids. These occur in the veins that are just inside the rectum. They may poke through to the outside and become irritated and painful.  External hemorrhoids. These occur in the veins that are outside the anus and can be felt as a painful swelling or hard lump  near the anus. Most hemorrhoids do not cause serious problems, and they can be managed with home treatments such as diet and lifestyle changes. If home treatments do not help the symptoms, procedures can be done to shrink or remove the hemorrhoids. What are the causes? This condition is caused by increased pressure in the anal area. This pressure may result from various things, including:  Constipation.  Straining to have a bowel movement.  Diarrhea.  Pregnancy.  Obesity.  Sitting for long periods of time.  Heavy lifting or other activity that causes you to strain.  Anal sex.  Riding a bike for a long period of time. What are the signs or symptoms? Symptoms of this condition include:  Pain.  Anal itching or irritation.  Rectal bleeding.  Leakage of stool (feces).  Anal swelling.  One or more lumps around the anus. How is this diagnosed? This condition can often be diagnosed through a visual exam. Other exams or tests may also be done, such as:  An exam that involves feeling the rectal area with a gloved hand (digital rectal exam).  An exam of the anal canal that is done using a small tube (anoscope).  A blood test, if you have lost a significant amount of blood.  A test to look inside the colon using a flexible tube with a camera on the end (sigmoidoscopy or colonoscopy). How is this treated? This condition can usually be treated at home. However, various procedures may be done if dietary changes, lifestyle changes, and other home treatments do not help your symptoms. These procedures can help make the hemorrhoids smaller or remove them completely. Some of these procedures involve surgery, and others do not. Common procedures include:  Rubber band ligation. Rubber bands are placed at the base of the hemorrhoids to cut off their blood supply.  Sclerotherapy. Medicine is injected into the hemorrhoids to shrink them.  Infrared coagulation. A type of light energy is  used to get rid of the hemorrhoids.  Hemorrhoidectomy surgery. The hemorrhoids are surgically removed, and the veins that supply them are tied off.  Stapled hemorrhoidopexy surgery. The surgeon staples the base of the hemorrhoid to the rectal wall. Follow these instructions at home: Eating and drinking   Eat foods that have a lot of fiber in them, such as whole grains, beans, nuts, fruits, and vegetables.  Ask your health care provider about taking products that  have added fiber (fiber supplements).  Reduce the amount of fat in your diet. You can do this by eating low-fat dairy products, eating less red meat, and avoiding processed foods.  Drink enough fluid to keep your urine pale yellow. Managing pain and swelling   Take warm sitz baths for 20 minutes, 3-4 times a day to ease pain and discomfort. You may do this in a bathtub or using a portable sitz bath that fits over the toilet.  If directed, apply ice to the affected area. Using ice packs between sitz baths may be helpful. ? Put ice in a plastic bag. ? Place a towel between your skin and the bag. ? Leave the ice on for 20 minutes, 2-3 times a day. General instructions  Take over-the-counter and prescription medicines only as told by your health care provider.  Use medicated creams or suppositories as told.  Get regular exercise. Ask your health care provider how much and what kind of exercise is best for you. In general, you should do moderate exercise for at least 30 minutes on most days of the week (150 minutes each week). This can include activities such as walking, biking, or yoga.  Go to the bathroom when you have the urge to have a bowel movement. Do not wait.  Avoid straining to have bowel movements.  Keep the anal area dry and clean. Use wet toilet paper or moist towelettes after a bowel movement.  Do not sit on the toilet for long periods of time. This increases blood pooling and pain.  Keep all follow-up visits  as told by your health care provider. This is important. Contact a health care provider if you have:  Increasing pain and swelling that are not controlled by treatment or medicine.  Difficulty having a bowel movement, or you are unable to have a bowel movement.  Pain or inflammation outside the area of the hemorrhoids. Get help right away if you have:  Uncontrolled bleeding from your rectum. Summary  Hemorrhoids are swollen veins in and around the rectum or anus.  Most hemorrhoids can be managed with home treatments such as diet and lifestyle changes.  Taking warm sitz baths can help ease pain and discomfort.  In severe cases, procedures or surgery can be done to shrink or remove the hemorrhoids. This information is not intended to replace advice given to you by your health care provider. Make sure you discuss any questions you have with your health care provider. Document Revised: 06/29/2018 Document Reviewed: 06/22/2017 Elsevier Patient Education  [place].

## 2020-02-13 NOTE — H&P (Signed)
Lori Ward is an 54 y.o. female.   Chief Complaint: Patient is here for colonoscopy. HPI: Patient is 54 year old African-American female who is here for screening colonoscopy.  She denies abdominal pain change in bowel habits or rectal bleeding.  Appetite is good and weight is stable.  Family history is negative for CRC.  She does not take aspirin NSAIDs or anticoagulants.  Past Medical History:  Diagnosis Date  . GERD (gastroesophageal reflux disease)     Past Surgical History:  Procedure Laterality Date  . CESAREAN SECTION    . EYE SURGERY      Family History  Problem Relation Age of Onset  . Colon cancer Neg Hx    Social History:  reports that she has never smoked. She has never used smokeless tobacco. She reports that she does not drink alcohol and does not use drugs.  Allergies:  Allergies  Allergen Reactions  . Bee Venom Swelling    "Stomach swells up."    Medications Prior to Admission  Medication Sig Dispense Refill  . Biotin 1000 MCG tablet Take 1,000 mcg by mouth daily.    Marland Kitchen esomeprazole (NEXIUM) 40 MG capsule Take 1 capsule (40 mg total) by mouth daily before breakfast. 30 capsule 6  . Multiple Vitamins-Minerals (CENTRUM ULTRA WOMENS) TABS Take by mouth daily.    . naproxen sodium (ALEVE) 220 MG tablet Take 220 mg by mouth daily as needed (Muscle spasm).    . Soft Lens Products (CLEAR EYES CONTACT LENS RELIEF) SOLN Place 1 drop into both eyes daily as needed (eye irritation due to contacts).      Results for orders placed or performed during the hospital encounter of 02/12/20 (from the past 48 hour(s))  SARS CORONAVIRUS 2 (TAT 6-24 HRS) Nasopharyngeal Nasopharyngeal Swab     Status: None   Collection Time: 02/12/20  8:03 AM   Specimen: Nasopharyngeal Swab  Result Value Ref Range   SARS Coronavirus 2 NEGATIVE NEGATIVE    Comment: (NOTE) SARS-CoV-2 target nucleic acids are NOT DETECTED.  The SARS-CoV-2 RNA is generally detectable in upper and  lower respiratory specimens during the acute phase of infection. Negative results do not preclude SARS-CoV-2 infection, do not rule out co-infections with other pathogens, and should not be used as the sole basis for treatment or other patient management decisions. Negative results must be combined with clinical observations, patient history, and epidemiological information. The expected result is Negative.  Fact Sheet for Patients: SugarRoll.be  Fact Sheet for Healthcare Providers: https://www.woods-mathews.com/  This test is not yet approved or cleared by the [place] FDA and  has been authorized for detection and/or diagnosis of SARS-CoV-2 by FDA under an Emergency Use Authorization (EUA). This EUA will remain  in effect (meaning this test can be used) for the duration of the COVID-19 declaration under Se ction 564(b)(1) of the Act, 21 U.S.C. section 360bbb-3(b)(1), unless the authorization is terminated or revoked sooner.  Performed at [facility], [place] [address] [place], [place] [address]    No results found.  Review of Systems  Blood pressure 138/70, pulse 78, temperature 98.4 F (36.9 C), temperature source Oral, resp. rate 20, height 5\' 7"  (1.702 m), weight 88.5 kg, last menstrual period 01/15/2020, SpO2 99 %. Physical Exam HENT:     Mouth/Throat:     Mouth: Mucous membranes are moist.     Pharynx: Oropharynx is clear.  Eyes:     General: No scleral icterus.    Conjunctiva/sclera: Conjunctivae normal.  Cardiovascular:     Rate and Rhythm: Normal rate and regular rhythm.     Heart sounds: Normal heart sounds. No murmur heard.   Pulmonary:     Effort: Pulmonary effort is normal.     Breath sounds: Normal breath sounds.  Abdominal:     General: There is no distension.     Palpations: Abdomen is soft. There is no mass.     Tenderness: There is no abdominal tenderness.  Musculoskeletal:     Cervical back:  Neck supple.  Lymphadenopathy:     Cervical: No cervical adenopathy.  Skin:    General: Skin is warm and dry.  Neurological:     Mental Status: She is alert.      Assessment/Plan  Average risk screening colonoscopy  Lionel December, MD 02/13/2020, 8:28 AM

## 2020-02-13 NOTE — Op Note (Signed)
[facility] Patient Name: Lori Ward Procedure Date: 02/13/2020 8:10 AM MRN: [id number] Date of Birth: 09-30-1965 Attending MD: Hildred Laser , MD CSN: [id number] Age: 54 Admit Type: Outpatient Procedure:                Colonoscopy Indications:              Screening for colorectal malignant neoplasm Providers:                Hildred Laser, MD, Otis Peak B. Sharon Seller, RN, Wynonia Musty Tech, Technician, Suzan Garibaldi. Risa Grill, Technician Referring MD:             [facility] Medicines:                Meperidine 50 mg IV, Midazolam 10 mg IV Complications:            No immediate complications. Estimated Blood Loss:     Estimated blood loss was minimal. Procedure:                Pre-Anesthesia Assessment:                           - Prior to the procedure, a History and Physical                            was performed, and patient medications and                            allergies were reviewed. The patient's tolerance of                            previous anesthesia was also reviewed. The risks                            and benefits of the procedure and the sedation                            options and risks were discussed with the patient.                            All questions were answered, and informed consent                            was obtained. Prior Anticoagulants: The patient has                            taken no previous anticoagulant or antiplatelet                            agents except for NSAID medication. ASA Grade  Assessment: I - A normal, healthy patient. After                            reviewing the risks and benefits, the patient was                            deemed in satisfactory condition to undergo the                            procedure.                           After obtaining informed consent, the colonoscope                            was  passed under direct vision. Throughout the                            procedure, the patient's blood pressure, pulse, and                            oxygen saturations were monitored continuously. The                            [id number]-[id number] ([id number]) scope was introduced through                            the anus and advanced to the the cecum, identified                            by appendiceal orifice and ileocecal valve. The                            colonoscopy was performed without difficulty. The                            patient tolerated the procedure well. The quality                            of the bowel preparation was excellent. The                            ileocecal valve, appendiceal orifice, and rectum                            were photographed. Scope In: 8:41:07 AM Scope Out: 9:21:07 AM Scope Withdrawal Time: 0 hours 32 minutes 40 seconds  Total Procedure Duration: 0 hours 40 minutes 0 seconds  Findings:      The perianal and digital rectal examinations were normal.      Two polyps were found in the ascending colon and cecum. The polyps were       small in size. These polyps were removed with a cold snare. Resection       and retrieval were complete. The pathology specimen was placed into  Bottle Number 2.      A 15 mm polyp was found in the proximal sigmoid colon. The polyp was       flat. Area was successfully injected with 8 mL Eleview for lesion       assessment, and this injection appeared to lift the lesion adequately.       The polyp was removed with a piecemeal technique using a hot snare.       Polyp resection was incomplete. The resected tissue was retrieved.       Coagulation for destruction of remaining portion of lesion using argon       plasma was successful. The pathology specimen was placed into Bottle       Number 2.      The exam was otherwise normal throughout the examined colon.      Internal hemorrhoids were found during retroflexion. The  hemorrhoids       were small. Impression:               - Two small polyps in the ascending colon and in                            the cecum, removed with a cold snare. Resected and                            retrieved.                           - One 15 mm polyp in the proximal sigmoid colon,                            removed piecemeal using a hot snare. Incomplete                            resection. Resected tissue retrieved. Injected.                            Residual polyp treated with argon plasma                            coagulation (APC) in order to complete polypectomy                           - Internal hemorrhoids. Moderate Sedation:      Moderate (conscious) sedation was administered by the endoscopy nurse       and supervised by the endoscopist. The following parameters were       monitored: oxygen saturation, heart rate, blood pressure, CO2       capnography and response to care. Total physician intraservice time was       46 minutes. Recommendation:           - Patient has a contact number available for                            emergencies. The signs and symptoms of potential  delayed complications were discussed with the                            patient. Return to normal activities tomorrow.                            Written discharge instructions were provided to the                            patient.                           - Resume previous diet today.                           - Continue present medications.                           - No aspirin, ibuprofen, naproxen, or other                            non-steroidal anti-inflammatory drugs for 7 days.                           - Await pathology results.                           - Repeat colonoscopy is recommended. The                            colonoscopy date will be determined after pathology                            results from today's exam become available for                             review. Procedure Code(s):        --- Professional ---                           [phone], Colonoscopy, flexible; with removal of                            tumor(s), polyp(s), or other lesion(s) by snare                            technique                           45381, Colonoscopy, flexible; with directed                            submucosal injection(s), any substance                           99153, Moderate sedation; each additional 15  minutes intraservice time                           99153, Moderate sedation; each additional 15                            minutes intraservice time                           G0500, Moderate sedation services provided by the                            same physician or other qualified health care                            professional performing a gastrointestinal                            endoscopic service that sedation supports,                            requiring the presence of an independent trained                            observer to assist in the monitoring of the                            patient's level of consciousness and physiological                            status; initial 15 minutes of intra-service time;                            patient age 25 years or older (additional time may                            be reported with 28366, as appropriate) Diagnosis Code(s):        --- Professional ---                           K63.5, Polyp of colon                           Z12.11, Encounter for screening for malignant                            neoplasm of colon                           K64.8, Other hemorrhoids CPT copyright 2019 [employer]. All rights reserved. The codes documented in this report are preliminary and upon coder review may  be revised to meet current compliance requirements. Lionel December, MD Lionel December, MD 02/13/2020 9:33:44 AM This report has  been signed electronically. Number of Addenda: 0

## 2020-02-17 LAB — SURGICAL PATHOLOGY

## 2020-02-19 ENCOUNTER — Encounter (HOSPITAL_COMMUNITY): Payer: Self-pay | Admitting: Internal Medicine

## 2020-03-06 ENCOUNTER — Encounter (INDEPENDENT_AMBULATORY_CARE_PROVIDER_SITE_OTHER): Payer: Self-pay | Admitting: *Deleted

## 2020-04-15 ENCOUNTER — Other Ambulatory Visit (HOSPITAL_COMMUNITY): Payer: Self-pay | Admitting: Obstetrics and Gynecology

## 2020-04-15 DIAGNOSIS — Z1231 Encounter for screening mammogram for malignant neoplasm of breast: Secondary | ICD-10-CM

## 2020-05-04 ENCOUNTER — Ambulatory Visit (HOSPITAL_COMMUNITY)
Admission: RE | Admit: 2020-05-04 | Discharge: 2020-05-04 | Disposition: A | Discharge status: Home / Self Care | Payer: BC Managed Care – PPO | Source: Ambulatory Visit | Attending: Obstetrics and Gynecology | Admitting: Obstetrics and Gynecology

## 2020-05-04 DIAGNOSIS — Z1231 Encounter for screening mammogram for malignant neoplasm of breast: Secondary | ICD-10-CM | POA: Diagnosis present

## 2020-05-07 ENCOUNTER — Other Ambulatory Visit (HOSPITAL_COMMUNITY): Payer: Self-pay | Admitting: Obstetrics and Gynecology

## 2020-05-07 DIAGNOSIS — R928 Other abnormal and inconclusive findings on diagnostic imaging of breast: Secondary | ICD-10-CM

## 2020-05-12 ENCOUNTER — Ambulatory Visit (HOSPITAL_COMMUNITY)
Admission: RE | Admit: 2020-05-12 | Discharge: 2020-05-12 | Disposition: A | Discharge status: Home / Self Care | Payer: BC Managed Care – PPO | Source: Ambulatory Visit | Attending: Obstetrics and Gynecology | Admitting: Obstetrics and Gynecology

## 2020-05-12 ENCOUNTER — Other Ambulatory Visit (HOSPITAL_COMMUNITY): Payer: Self-pay | Admitting: Obstetrics and Gynecology

## 2020-05-12 DIAGNOSIS — N6489 Other specified disorders of breast: Secondary | ICD-10-CM

## 2020-05-12 DIAGNOSIS — R928 Other abnormal and inconclusive findings on diagnostic imaging of breast: Secondary | ICD-10-CM

## 2020-05-18 ENCOUNTER — Other Ambulatory Visit: Payer: Self-pay

## 2020-05-18 ENCOUNTER — Other Ambulatory Visit: Payer: Self-pay | Admitting: Obstetrics and Gynecology

## 2020-05-18 ENCOUNTER — Ambulatory Visit
Admission: RE | Admit: 2020-05-18 | Discharge: 2020-05-18 | Disposition: A | Discharge status: Home / Self Care | Payer: BC Managed Care – PPO | Source: Ambulatory Visit | Attending: Obstetrics and Gynecology | Admitting: Obstetrics and Gynecology

## 2020-05-18 DIAGNOSIS — R928 Other abnormal and inconclusive findings on diagnostic imaging of breast: Secondary | ICD-10-CM

## 2020-05-18 DIAGNOSIS — N6489 Other specified disorders of breast: Secondary | ICD-10-CM

## 2020-12-04 ENCOUNTER — Telehealth (INDEPENDENT_AMBULATORY_CARE_PROVIDER_SITE_OTHER): Payer: Self-pay

## 2020-12-04 ENCOUNTER — Other Ambulatory Visit (INDEPENDENT_AMBULATORY_CARE_PROVIDER_SITE_OTHER): Payer: Self-pay

## 2020-12-04 DIAGNOSIS — R1310 Dysphagia, unspecified: Secondary | ICD-10-CM

## 2020-12-04 DIAGNOSIS — K219 Gastro-esophageal reflux disease without esophagitis: Secondary | ICD-10-CM

## 2020-12-04 MED ORDER — ESOMEPRAZOLE MAGNESIUM 40 MG PO CPDR: 40.0000 mg | DELAYED_RELEASE_CAPSULE | Freq: Every day | ORAL | 0 refills | Status: DC

## 2020-12-04 NOTE — Telephone Encounter (Signed)
Patient called today for a refill on Esomeprazole 40 mg . I advised we could give her # 30 capsules, but she would need set up an follow up appointment here as she is coming up to a year last seen in our office November 2021. She was in agreement with this and I sent in the rx for a (1) months supply and transferred her to the front desk to schedule an appointment.

## 2021-04-08 ENCOUNTER — Ambulatory Visit (INDEPENDENT_AMBULATORY_CARE_PROVIDER_SITE_OTHER): Payer: BC Managed Care – PPO | Admitting: Gastroenterology

## 2021-04-08 ENCOUNTER — Other Ambulatory Visit: Payer: Self-pay

## 2021-04-08 ENCOUNTER — Encounter (INDEPENDENT_AMBULATORY_CARE_PROVIDER_SITE_OTHER): Payer: Self-pay | Admitting: Gastroenterology

## 2021-04-08 DIAGNOSIS — R143 Flatulence: Secondary | ICD-10-CM

## 2021-04-08 DIAGNOSIS — K219 Gastro-esophageal reflux disease without esophagitis: Secondary | ICD-10-CM | POA: Diagnosis not present

## 2021-04-08 DIAGNOSIS — R142 Eructation: Secondary | ICD-10-CM

## 2021-04-08 MED ORDER — ESOMEPRAZOLE MAGNESIUM 40 MG PO CPDR
40.0000 mg | DELAYED_RELEASE_CAPSULE | Freq: Every day | ORAL | 3 refills | Status: DC
Start: 2021-04-08 — End: 2022-04-11

## 2021-04-08 MED ORDER — ESOMEPRAZOLE MAGNESIUM 40 MG PO CPDR: 40.0000 mg | DELAYED_RELEASE_CAPSULE | Freq: Every day | ORAL | 3 refills | Status: DC

## 2021-04-08 NOTE — Progress Notes (Signed)
Primary Care Physician:  [facility]  Primary GI: Rehman  Patient Location: Home   Provider Location: [place] office   Reason for Visit: GERD   Persons present on the virtual encounter, with roles: Lori Ward L. Alver Sorrow, MSN, APRN, AGNP-C, Lori Ward, patient    Total time (minutes) spent on medical discussion: 10 minutes  Virtual Visit via telephone Visit is conducted via telephone and was requested by patient.   I connected with Lori Ward on 04/08/21 at  3:30 PM EST by telephone and verified that I am speaking with the correct person using two identifiers.   I discussed the limitations, risks, security and privacy concerns of performing an evaluation and management service by telephone and the availability of in person appointments. I also discussed with the patient that there may be a patient responsible charge related to this service. The patient expressed understanding and agreed to proceed.  Chief Complaint  Patient presents with   Gastroesophageal Reflux    Follow up on GERD. Takes esomeprazole and doing well with med. No concerns today. Needs refills.    History of Present Illness: Lori Ward is a 56 year old female with pmh of GERD. Last seen in November 2021  Previously on nexium 20mg  daily but having breakthrough reflux, increased to 40mg  daily at last visit.  Today, patient states that she is doing well on nexium 40mg  once daily, states that reflux symptoms are well controlled and only notices symptoms if she misses a dose. Appetite is good, weight is stable.  She does endorse more belching and flatulence recently, but denies any associated symptoms such as constipation, diarrhea, nausea, vomiting, abdominal pain or early satiety. Has not taken anything for the excess gas, thinks it could be related to her diet. Previously having dysphagia at last OV, however, she states this resolved after increasing esomeprazole to 40mg  once daily.  No red flag symptoms. No episodes of rectal bleeding or melena.   Last EGD: Last Colonoscopy: 02/13/20- Two small polyps in the ascending colon and in the cecum-tubular adenomas - One 15 mm polyp in the proximal sigmoid colon, removed piecemeal using a hot snare. Incomplete resection. Resected tissue retrieved. Injected. Residual polyp treated with argon plasma coagulation (APC) in order to complete polypectomy-sessile serrated without dysplasia - Internal hemorrhoids.  Repeat colonoscopy in 3 years  Past Medical History:  Diagnosis Date   GERD (gastroesophageal reflux disease)     Past Surgical History:  Procedure Laterality Date   CESAREAN SECTION     COLONOSCOPY N/A 02/13/2020   Procedure: COLONOSCOPY;  Surgeon: Rogene Houston, MD;  [facility];  Service: Endoscopy;  Laterality: N/A;  [place] HEMOSTASIS  02/13/2020   Procedure: HOT HEMOSTASIS (ARGON PLASMA COAGULATION/BICAP);  Surgeon: Rogene Houston, MD;  [facility];  Service: Endoscopy;;   POLYPECTOMY  02/13/2020   Procedure: POLYPECTOMY;  Surgeon: Rogene Houston, MD;  [facility];  Service: Endoscopy;;    Current Meds  Medication Sig   Biotin 1000 MCG tablet Take 1,000 mcg by mouth daily.   esomeprazole (NEXIUM) 40 MG capsule Take 1 capsule (40 mg total) by mouth daily before breakfast.   Multiple Vitamins-Minerals (CENTRUM ULTRA WOMENS) TABS Take by mouth daily.   naproxen sodium (ALEVE) 220 MG tablet Take 220 mg by mouth daily as needed (Muscle spasm).   Soft Lens Products (CLEAR EYES CONTACT LENS RELIEF) SOLN Place  1 drop into both eyes daily as needed (eye irritation due to contacts).     Family History  Problem Relation Age of Onset   Colon cancer Neg Hx     Social History   Socioeconomic History   Marital status: Single    Spouse name: Not on file   Number of children: Not on file   Years of education: Not on file   Highest education level: Not  on file  Occupational History   Not on file  Tobacco Use   Smoking status: Never   Smokeless tobacco: Never  Vaping Use   Vaping Use: Never used  Substance and Sexual Activity   Alcohol use: No    Comment: one drink about 2 days a week.   Drug use: No   Sexual activity: Yes    Birth control/protection: Pill  Other Topics Concern   Not on file  Social History Narrative   Not on file   Social Determinants of Health   Financial Resource Strain: Not on file  Food Insecurity: Not on file  Transportation Needs: Not on file  Physical Activity: Not on file  Stress: Not on file  Social Connections: Not on file   Review of Systems: Gen: Denies fever, chills, anorexia. Denies fatigue, weakness, weight loss.  CV: Denies chest pain, palpitations, syncope, peripheral edema, and claudication. Resp: Denies dyspnea at rest, cough, wheezing, coughing up blood, and pleurisy. GI: see HPI Derm: Denies rash, itching, dry skin Psych: Denies depression, anxiety, memory loss, confusion. No homicidal or suicidal ideation.  Heme: Denies bruising, bleeding, and enlarged lymph nodes.  Observations/Objective: No distress. Unable to perform physical exam due to telephone encounter. No video available.   Assessment and Plan: [name] is a 56 year old female with pmh of GERD. Virtual visit today for follow up of GERD.  Maintained on esomeprazole 40mg  once daily, doing well on this without any breakthrough symptoms as long as she is compliant with her medication. We will continue with current regimen at this time. No red flag symptoms. Patient denies melena, hematochezia, nausea, vomiting, diarrhea, constipation, dysphagia, odyonophagia, early satiety or weight loss. Refill of esomprazole 40mg  daily sent to pharmacy.  She endorses some belching and flatulence, without any other associated symptoms. I discussed that often diet can influence presence of excess gas. She can try taking otc phazyme or  gas-x and watch her diet for certain foods such as cruciferous veggies and beans that can cause more gas. She will let me know if she develops worsening or new GI symptoms.  All questions were answered, patient verbalized understanding and is in agreement with plan as outline above.   Follow Up Instructions: 1 year Plan for repeat colonoscopy in dec 2024    I discussed the assessment and treatment plan with the patient. The patient was provided an opportunity to ask questions and all were answered. The patient agreed with the plan and demonstrated an understanding of the instructions.   The patient was advised to call back or seek an in-person evaluation if the symptoms worsen or if the condition fails to improve as anticipated.  I provided 10 minutes of discussion via telephone.  Kiyona Mcnall L. Alver Sorrow, MSN, APRN, AGNP-C Adult-Gerontology Nurse Practitioner [facility]

## 2021-04-08 NOTE — Patient Instructions (Signed)
Please continue esomeprazole 40mg  once daily, take this 30-45 minutes prior to breakfast Continue to avoid trigger foods such as greasy, spicy, tomato based foods, caffeine, chocolate and alcohol as these can worsen reflux. Stay upright 2-3 hours after eating, prior to lying down. For the gas, be mindful of things you are eating as certain foods, especially rougher vegetables and beans can produce more gas. You can try over the counter phazyme or gas-x to see if these help with the gas. Next colonoscopy due December 2024  Follow up for GERD in 1 year

## 2021-05-25 ENCOUNTER — Other Ambulatory Visit: Payer: Self-pay | Admitting: Nurse Practitioner

## 2021-05-25 ENCOUNTER — Other Ambulatory Visit (HOSPITAL_COMMUNITY): Payer: Self-pay | Admitting: Nurse Practitioner

## 2021-05-25 DIAGNOSIS — Z1231 Encounter for screening mammogram for malignant neoplasm of breast: Secondary | ICD-10-CM

## 2021-05-31 ENCOUNTER — Ambulatory Visit (HOSPITAL_COMMUNITY)
Admission: RE | Admit: 2021-05-31 | Discharge: 2021-05-31 | Disposition: A | Discharge status: Home / Self Care | Payer: BC Managed Care – PPO | Source: Ambulatory Visit | Attending: Nurse Practitioner | Admitting: Nurse Practitioner

## 2021-05-31 DIAGNOSIS — Z1231 Encounter for screening mammogram for malignant neoplasm of breast: Secondary | ICD-10-CM | POA: Insufficient documentation

## 2022-02-01 IMAGING — MG MM DIGITAL DIAGNOSTIC UNILAT*L* W/ TOMO W/ CAD
4 series · 4 of 12 positions shown · non-contrast
Comparison: Previous exams.

ACR Breast Density Category a: The breast tissue is almost entirely
fatty.

CLINICAL DATA: Screening recall for possible left breast mass.

EXAM:
DIGITAL DIAGNOSTIC UNILATERAL LEFT MAMMOGRAM WITH TOMOSYNTHESIS AND
CAD; ULTRASOUND LEFT BREAST LIMITED
TECHNIQUE: Left digital diagnostic mammography and breast tomosynthesis was
performed. The images were evaluated with computer-aided detection.;
Targeted ultrasound examination of the left breast was performed

[L CC synth-2D]
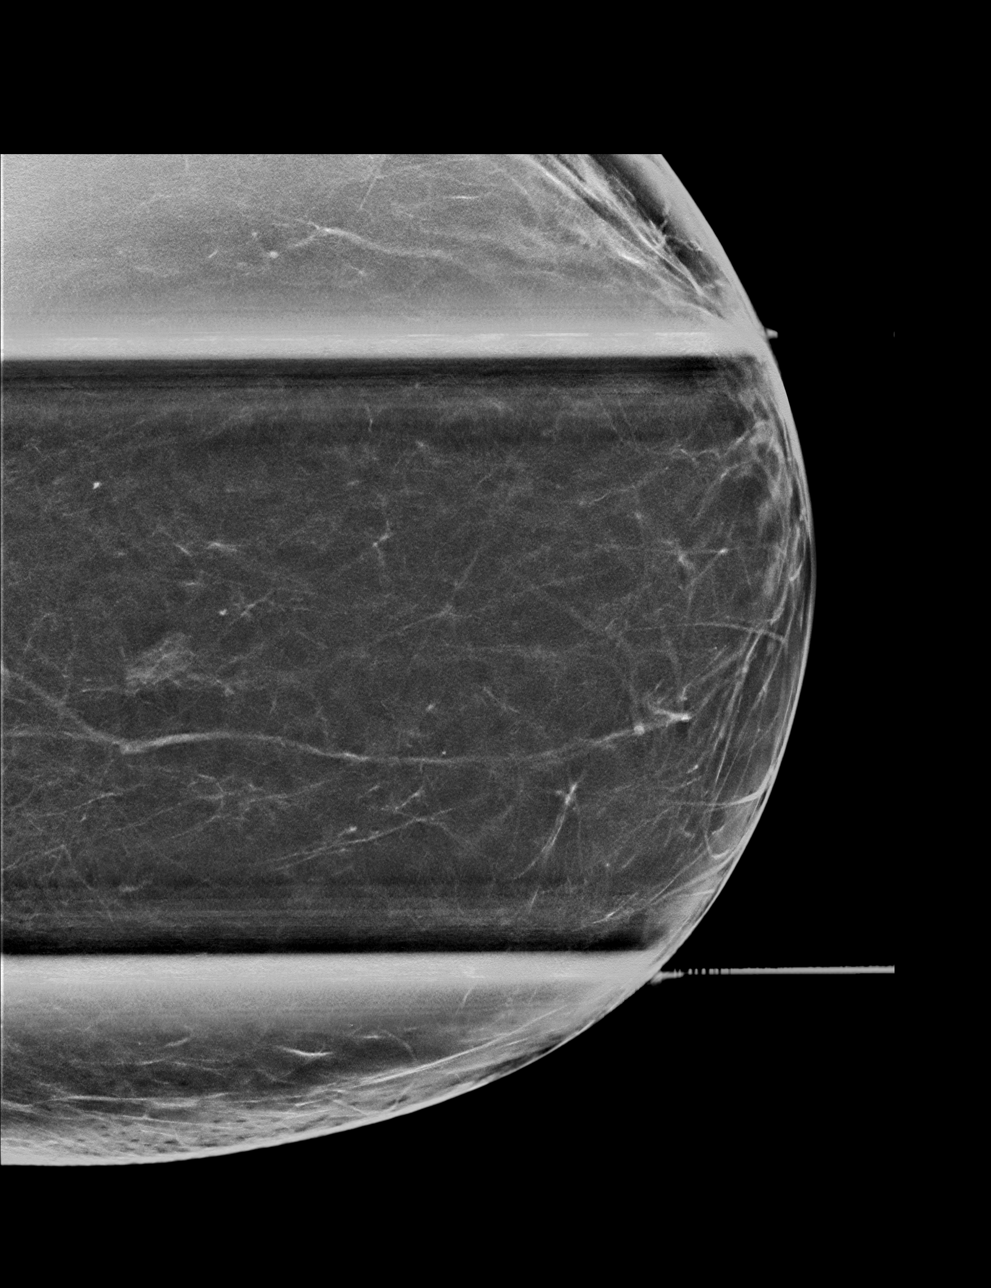

[L MLO synth-2D]
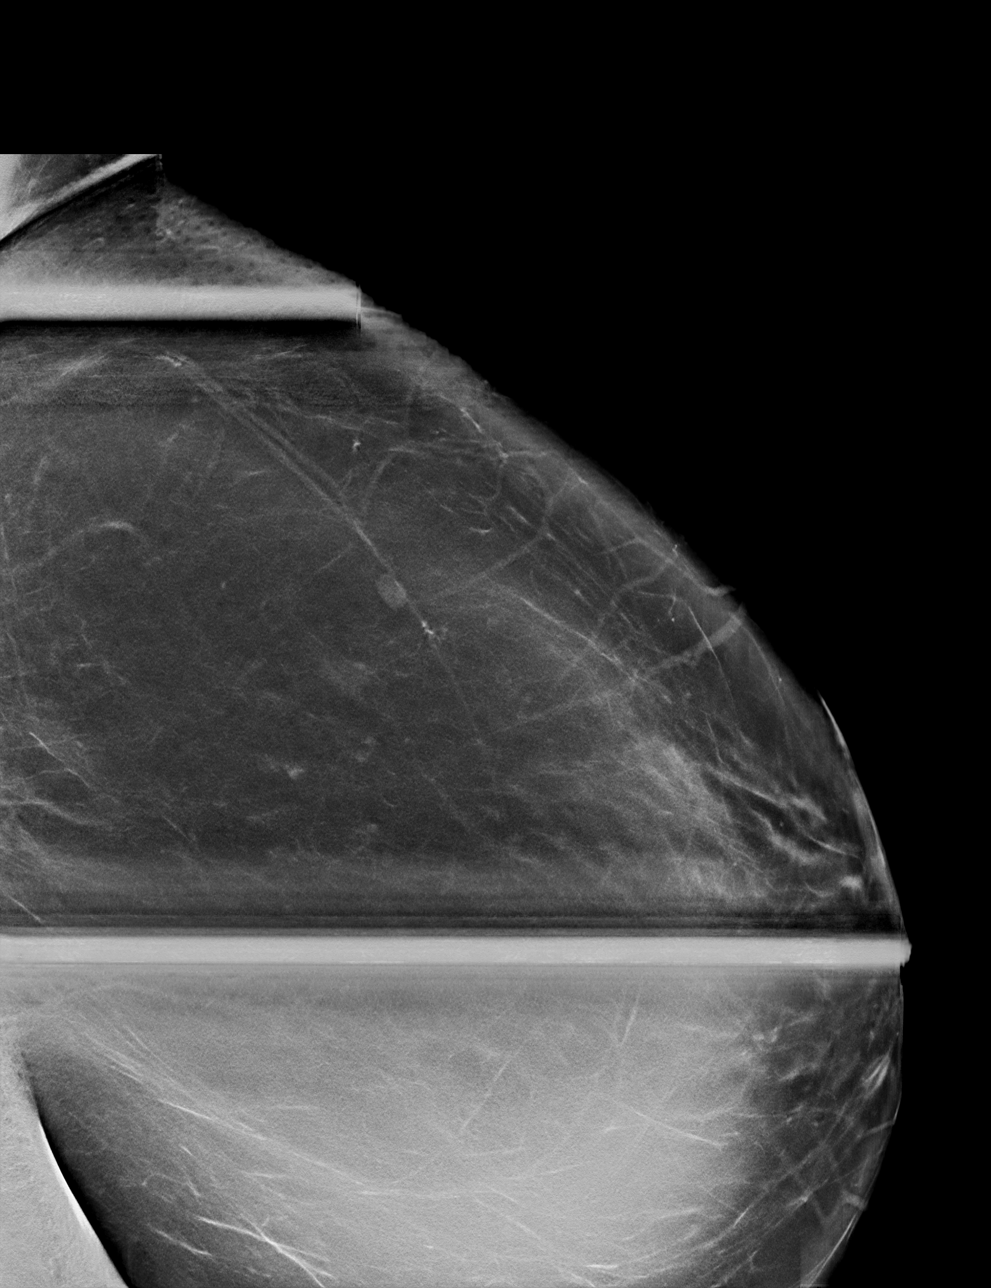

[L MLO tomo · tomo slice 49/96.0]
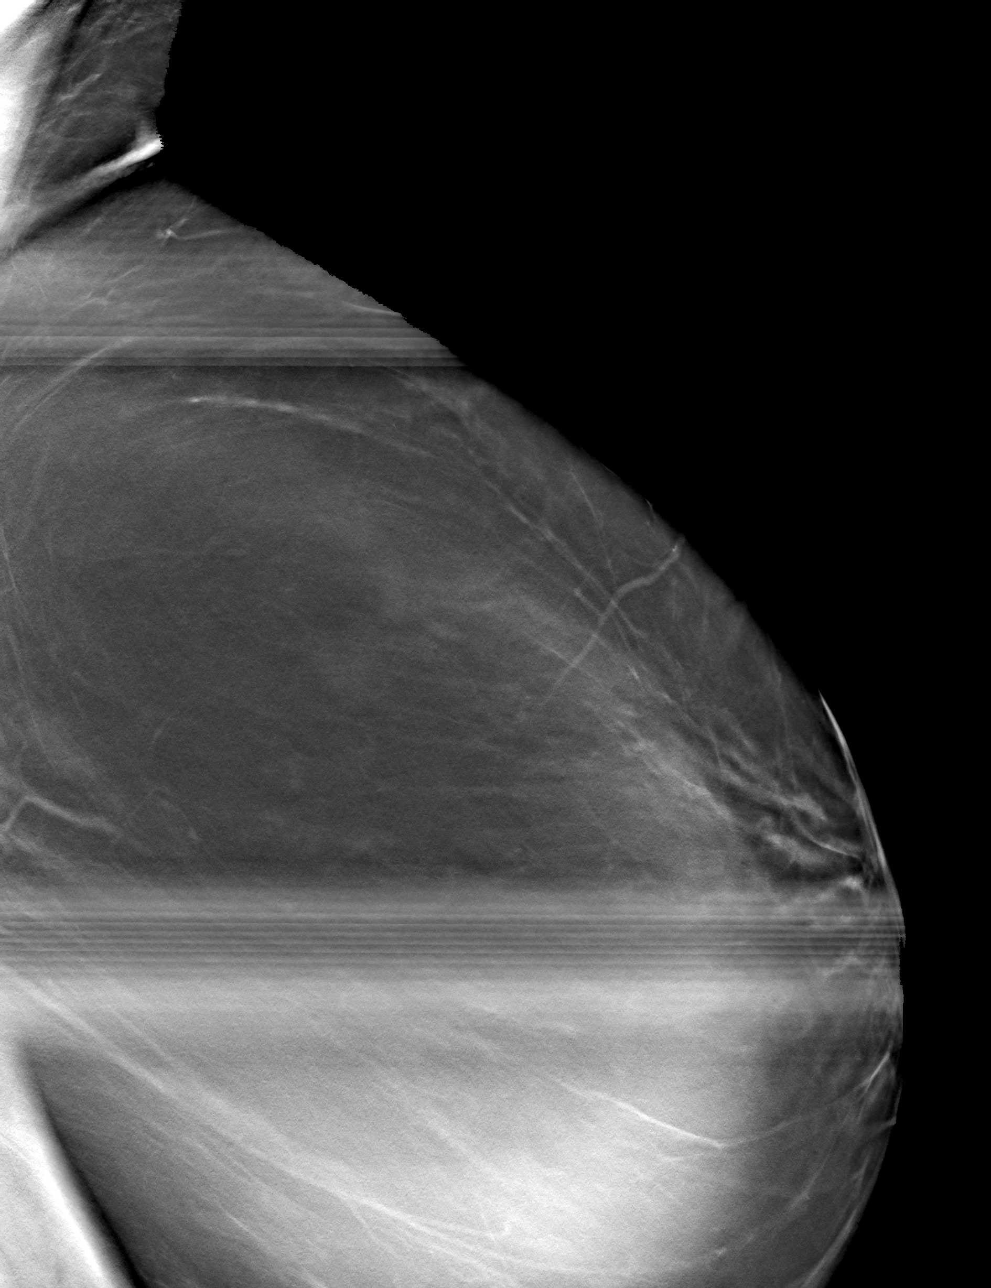

[L CC tomo · tomo slice 38/75.0]
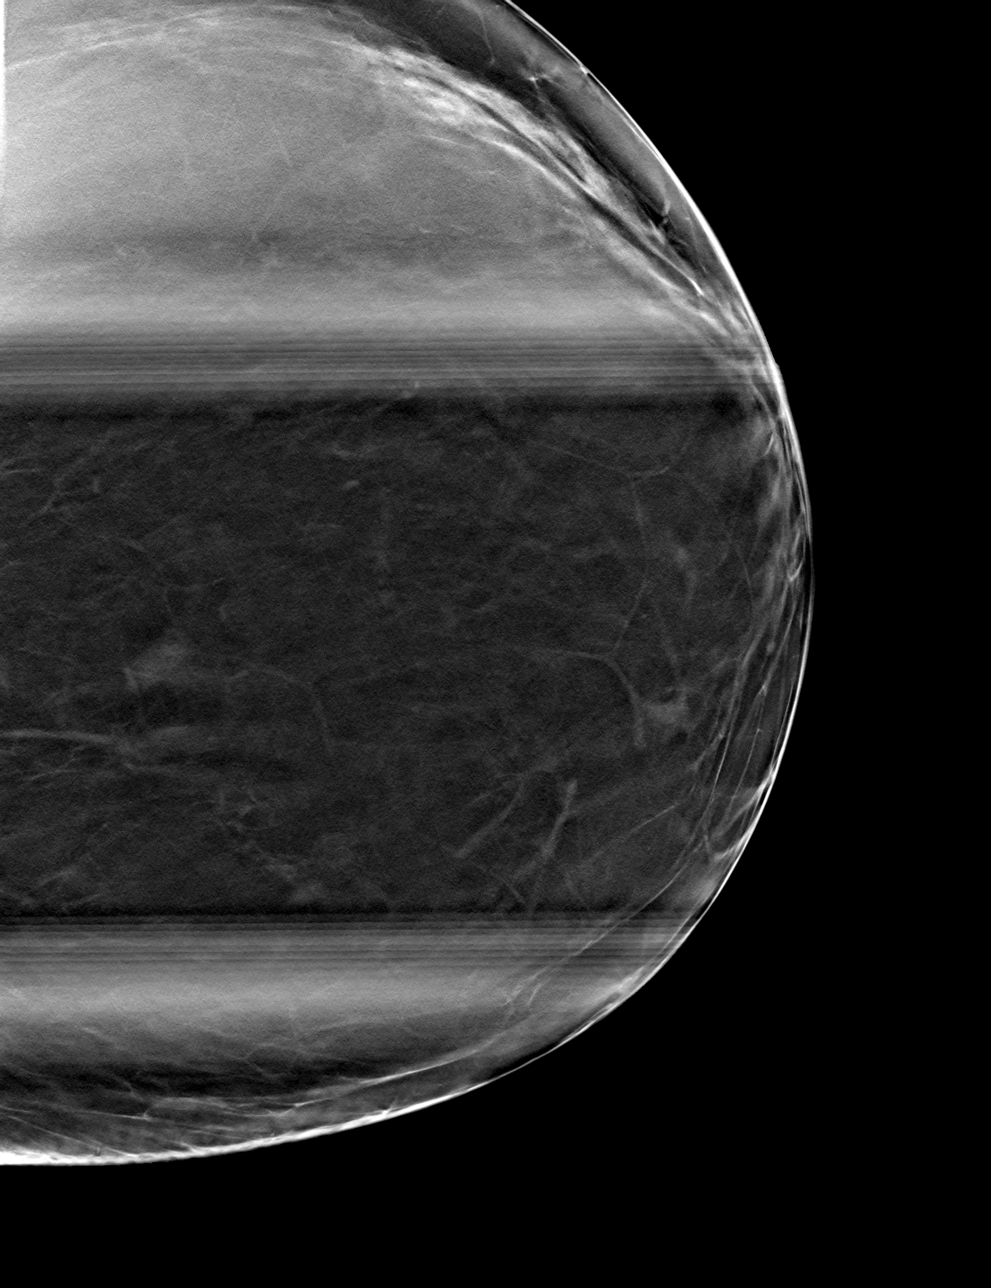

[4 of 12 positions shown; findings below may reference images not displayed]

FINDINGS: Spot compression tomograms were performed of the left breast. There
is a persistent low-density mass/asymmetry in the upper slightly
inner left breast measuring approximately 1 cm.

Targeted ultrasound of upper central and slightly inner left breast
was performed. There is no definite sonographic correlate for the
mass/asymmetry seen in the left breast. No lymphadenopathy seen in
the left axilla.
IMPRESSION: Indeterminate 1 cm mass seen in the upper slightly inner left breast
on mammography only.

RECOMMENDATION:
Recommend stereotactic guided biopsy of the mass in the upper
slightly inner left breast.

I have discussed the findings and recommendations with the patient.
If applicable, a reminder letter will be sent to the patient
regarding the next appointment.

BI-RADS CATEGORY  4: Suspicious.

## 2022-04-11 ENCOUNTER — Ambulatory Visit (INDEPENDENT_AMBULATORY_CARE_PROVIDER_SITE_OTHER): Payer: BC Managed Care – PPO | Admitting: Gastroenterology

## 2022-04-11 ENCOUNTER — Encounter (INDEPENDENT_AMBULATORY_CARE_PROVIDER_SITE_OTHER): Payer: Self-pay | Admitting: Gastroenterology

## 2022-04-11 VITALS — BP 116/60 | HR 69 | Temp 98.1°F | Ht 67.0 in | Wt 191.4 lb

## 2022-04-11 DIAGNOSIS — K219 Gastro-esophageal reflux disease without esophagitis: Secondary | ICD-10-CM | POA: Diagnosis not present

## 2022-04-11 DIAGNOSIS — R09A2 Foreign body sensation, throat: Secondary | ICD-10-CM

## 2022-04-11 MED ORDER — OMEPRAZOLE 40 MG PO CPDR: 40.0000 mg | DELAYED_RELEASE_CAPSULE | Freq: Every day | ORAL | 3 refills | Status: DC

## 2022-04-11 NOTE — Progress Notes (Addendum)
Referring Provider: [facility] Primary Care Physician:  [facility] Primary GI Physician: Jenetta Downer   Chief Complaint  Patient presents with   Follow-up    Pt arrives for one year follow up. No issues at this time.     HPI:   Lori Ward is a 57 y.o. female with past medical history of GERD  Patient presenting today for follow up of GERD.  Last seen virtually in February 2023, at that time taking nexium '40mg'$  daily with well controlled symptmos. Has some belching and flatulence.   Recommended to continue with nexium '40mg'$  daily, reflux precautions, otc gas x or phazyme for gas, monitor for dietary causes of gas as well. repeat colonoscopy December 2024.   Present:  Patient overall doing well, she has a globus sensation at times maybe twice per week, usually shortly after eating, no dysphagia or odynophagia. She denies heartburn or acid regurgitation, no sore throat, hoarseness or cough. Appetite is good. No weight loss. Denies rectal bleeding or melena.     Last Colonoscopy: 02/13/20- Two small polyps in the ascending colon and in the cecum-tubular adenomas - One 15 mm polyp in the proximal sigmoid colon, removed piecemeal using a hot snare. Incomplete resection. Resected tissue retrieved. Injected. Residual polyp treated with argon plasma coagulation (APC) in order to complete polypectomy-sessile serrated without dysplasia - Internal hemorrhoids.   Repeat colonoscopy in 3 years   Past Medical History:  Diagnosis Date   GERD (gastroesophageal reflux disease)     Past Surgical History:  Procedure Laterality Date   CESAREAN SECTION     COLONOSCOPY N/A 02/13/2020   Procedure: COLONOSCOPY;  Surgeon: Rogene Houston, MD;  [facility];  Service: Endoscopy;  Laterality: N/A;  [place] HEMOSTASIS  02/13/2020   Procedure: HOT HEMOSTASIS (ARGON PLASMA COAGULATION/BICAP);  Surgeon: Rogene Houston, MD;  [facility];  Service: Endoscopy;;   POLYPECTOMY  02/13/2020   Procedure: POLYPECTOMY;  Surgeon: Rogene Houston, MD;  [facility];  Service: Endoscopy;;    Current Outpatient Medications  Medication Sig Dispense Refill   Biotin 1000 MCG tablet Take 1,000 mcg by mouth daily.     esomeprazole (NEXIUM) 40 MG capsule Take 1 capsule (40 mg total) by mouth daily before breakfast. 90 capsule 3   Multiple Vitamins-Minerals (CENTRUM ULTRA WOMENS) TABS Take by mouth daily.     naproxen sodium (ALEVE) 220 MG tablet Take 220 mg by mouth daily as needed (Muscle spasm).     Soft Lens Products (CLEAR EYES CONTACT LENS RELIEF) SOLN Place 1 drop into both eyes daily as needed (eye irritation due to contacts).     No current facility-administered medications for this visit.    Allergies as of 04/11/2022 - Review Complete 04/11/2022  Allergen Reaction Noted   Bee venom Swelling 07/07/2018    Family History  Problem Relation Age of Onset   Colon cancer Neg Hx     Social History   Socioeconomic History   Marital status: Single    Spouse name: Not on file   Number of children: Not on file   Years of education: Not on file   Highest education level: Not on file  Occupational History   Not on file  Tobacco Use   Smoking status: Never   Smokeless tobacco: Never  Vaping Use   Vaping Use: Never used  Substance and Sexual Activity  Alcohol use: No    Comment: one drink about 2 days a week.   Drug use: No   Sexual activity: Yes    Birth control/protection: Pill  Other Topics Concern   Not on file  Social History Narrative   Not on file   Social Determinants of Health   Financial Resource Strain: Not on file  Food Insecurity: Not on file  Transportation Needs: Not on file  Physical Activity: Not on file  Stress: Not on file  Social Connections: Not on file   Review of systems General: negative for malaise, night sweats, fever, chills, weight loss Neck: Negative for  lumps, goiter, pain and significant neck swelling Resp: Negative for cough, wheezing, dyspnea at rest CV: Negative for chest pain, leg swelling, palpitations, orthopnea GI: denies melena, hematochezia, nausea, vomiting, diarrhea, constipation, dysphagia, odyonophagia, early satiety or unintentional weight loss. +globus sensation  MSK: Negative for joint pain or swelling, back pain, and muscle pain. Derm: Negative for itching or rash Psych: Denies depression, anxiety, memory loss, confusion. No homicidal or suicidal ideation.  Heme: Negative for prolonged bleeding, bruising easily, and swollen nodes. Endocrine: Negative for cold or heat intolerance, polyuria, polydipsia and goiter. Neuro: negative for tremor, gait imbalance, syncope and seizures. The remainder of the review of systems is noncontributory.  Physical Exam: BP 116/60 (BP Location: Left Arm, Patient Position: Sitting, Cuff Size: Normal)   Pulse 69   Temp 98.1 F (36.7 C)   Ht '5\' 7"'$  (1.702 m)   Wt 191 lb 6.4 oz (86.8 kg)   BMI 29.98 kg/m  General:   Alert and oriented. No distress noted. Pleasant and cooperative.  Head:  Normocephalic and atraumatic. Eyes:  Conjuctiva clear without scleral icterus. Mouth:  Oral mucosa pink and moist. Good dentition. No lesions. Heart: Normal rate and rhythm, s1 and s2 heart sounds present.  Lungs: Clear lung sounds in all lobes. Respirations equal and unlabored. Abdomen:  +BS, soft, non-tender and non-distended. No rebound or guarding. No HSM or masses noted. Derm: No palmar erythema or jaundice Msk:  Symmetrical without gross deformities. Normal posture. Extremities:  Without edema. Neurologic:  Alert and  oriented x4 Psych:  Alert and cooperative. Normal mood and affect.  Invalid input(s): "6 MONTHS"   ASSESSMENT: Lori Ward is a 57 y.o. female presenting today for follow up of GERD.  GERD: maintained on nexium '40mg'$  daily, she denies any heartburn or acid regurgitation, however  is having a globus sensation maybe twice per week.  No cough, sore throat or hoarseness.  Query if she is having some silent reflux symptoms, she has been on Nexium for the past few years.  Recommend she stop Nexium and start omeprazole 40 mg once daily.  Should maintain good reflux precautions to include avoiding greasy, spicy, tomato-based, citrus foods, and be mindful of caffeine, carbonated drinks, chocolate and alcohol can increase reflux symptoms.  She should remain eating upright position for 2 to 3 hours after eating, prior to laying down.  She will let me know if symptoms do not improve with change PPI therapy.  Due for screening colonoscopy in December 2024 will place her on the recall list to be scheduled closer to time.    PLAN:  1.stop nexium 2. Start omeprazole '40mg'$  daily  3. Reflux precautions  4. Repeat colonoscopy December 2024  All questions were answered, patient verbalized understanding and is in agreement with plan as outlined above.    Follow Up: 1 year  Ryelan Kazee L. Alver Sorrow, MSN,  APRN, AGNP-C Adult-Gerontology Nurse Practitioner [facility]  I have reviewed the note and agree with the APP's assessment as described in this progress note  Maylon Peppers, MD Gastroenterology and Hepatology [facility]

## 2022-04-11 NOTE — Progress Notes (Deleted)
Lori Ward doing well, she has a globus sensation at times, no dysphagia or odynophagia. She denies heartburn or acid regurgtiaton no sore throat or cough. Appetite is good. No weight loss. Denies rectal bleeding or melena.

## 2022-04-11 NOTE — Patient Instructions (Signed)
Please stop nexium and start omeprazole '40mg'$  daily Please take this 30 minutes prior to breakfast Avoid greasy, spicy, fried, citrus foods, and be mindful that caffeine, carbonated drinks, chocolate and alcohol can increase reflux symptoms Stay upright 2-3 hours after eating, prior to lying down and avoid eating late in the evenings.  Will plan to repeat screening colonoscopy in December 2024  Please let me know if you have any new or worsening GI symptoms, follow up 1 year.

## 2022-04-11 NOTE — Addendum Note (Signed)
Addended by: Harvel Quale on: 04/11/2022 09:55 PM   Modules accepted: Level of Service

## 2022-08-22 ENCOUNTER — Other Ambulatory Visit (HOSPITAL_COMMUNITY): Payer: Self-pay | Admitting: Nurse Practitioner

## 2022-08-22 DIAGNOSIS — Z1231 Encounter for screening mammogram for malignant neoplasm of breast: Secondary | ICD-10-CM

## 2022-08-29 ENCOUNTER — Encounter (HOSPITAL_COMMUNITY): Payer: Self-pay

## 2022-08-29 ENCOUNTER — Ambulatory Visit (HOSPITAL_COMMUNITY)
Admission: RE | Admit: 2022-08-29 | Discharge: 2022-08-29 | Disposition: A | Discharge status: Home / Self Care | Payer: BC Managed Care – PPO | Source: Ambulatory Visit | Attending: Nurse Practitioner | Admitting: Nurse Practitioner

## 2022-08-29 DIAGNOSIS — Z1231 Encounter for screening mammogram for malignant neoplasm of breast: Secondary | ICD-10-CM | POA: Insufficient documentation

## 2023-01-09 ENCOUNTER — Encounter (INDEPENDENT_AMBULATORY_CARE_PROVIDER_SITE_OTHER): Payer: Self-pay | Admitting: *Deleted

## 2023-02-20 IMAGING — MG MM DIGITAL SCREENING BILAT W/ TOMO AND CAD
6 of 10 series · 6 of 30 positions shown · non-contrast
Comparison: Previous exam(s).

CLINICAL DATA: Screening.

EXAM:
DIGITAL SCREENING BILATERAL MAMMOGRAM WITH TOMOSYNTHESIS AND CAD
TECHNIQUE: Bilateral screening digital craniocaudal and mediolateral oblique
mammograms were obtained. Bilateral screening digital breast
tomosynthesis was performed. The images were evaluated with
computer-aided detection.

[L CC synth-2D]
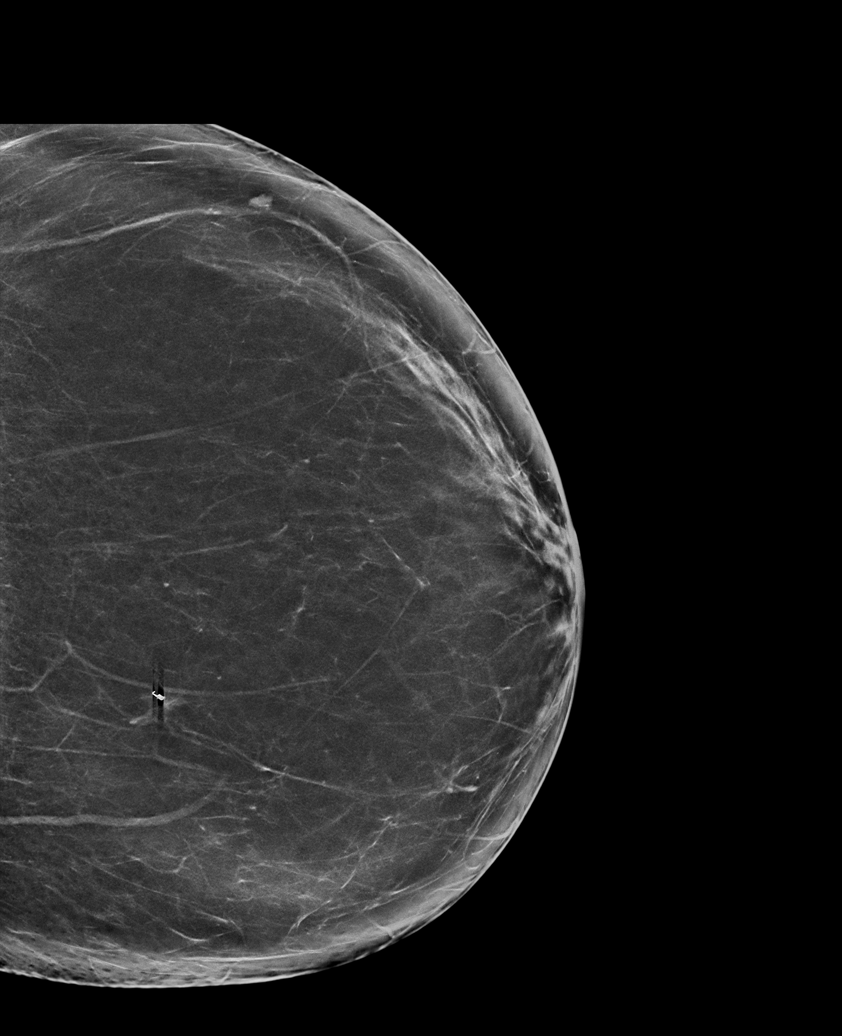

[R MLO synth-2D]
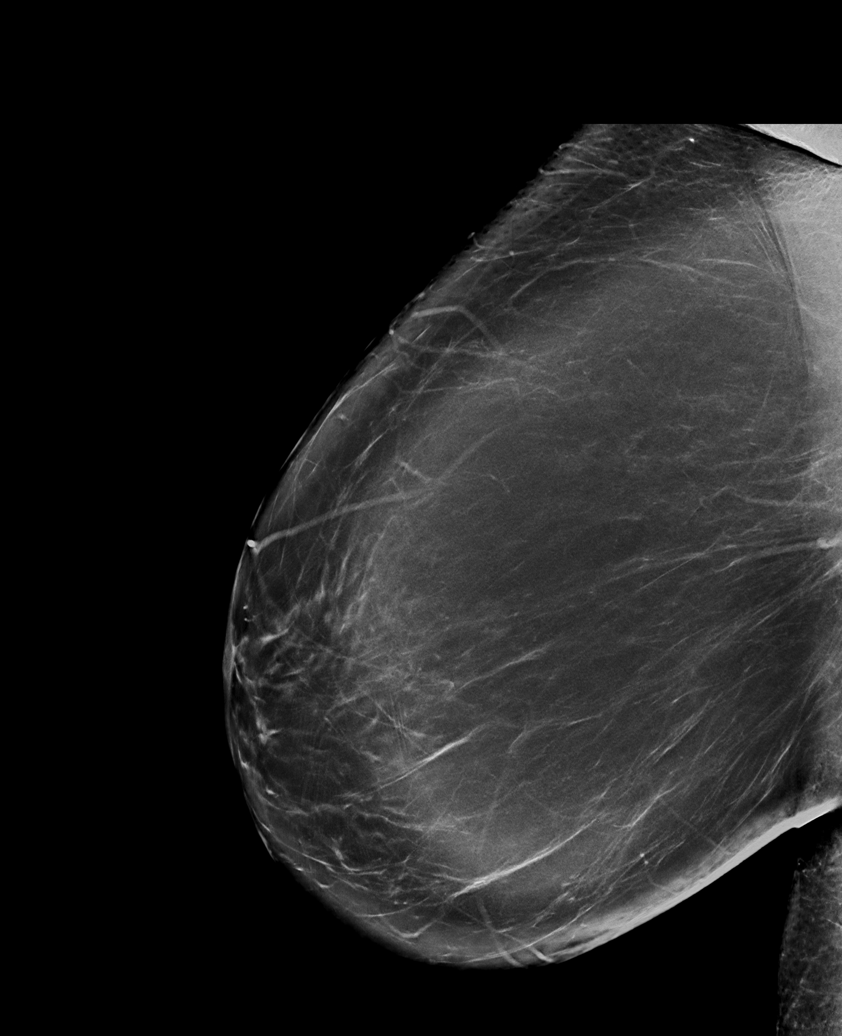

[L MLO synth-2D (1 of 2)]
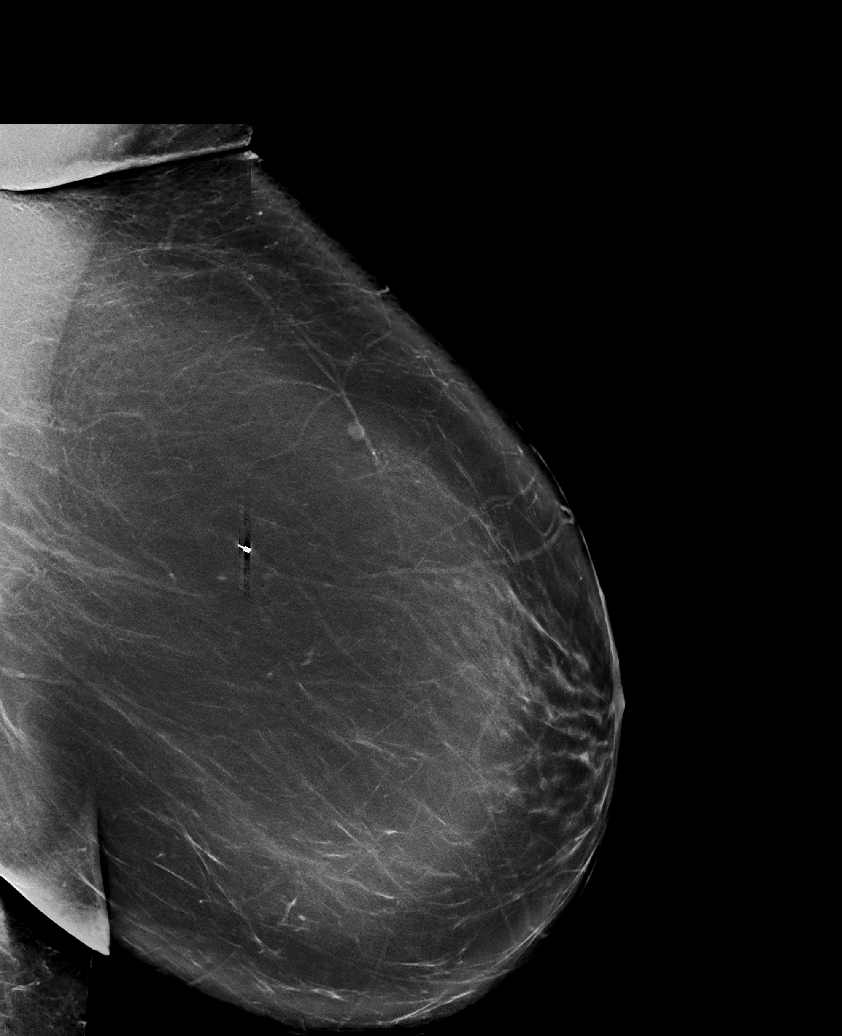

[L MLO synth-2D (2 of 2)]
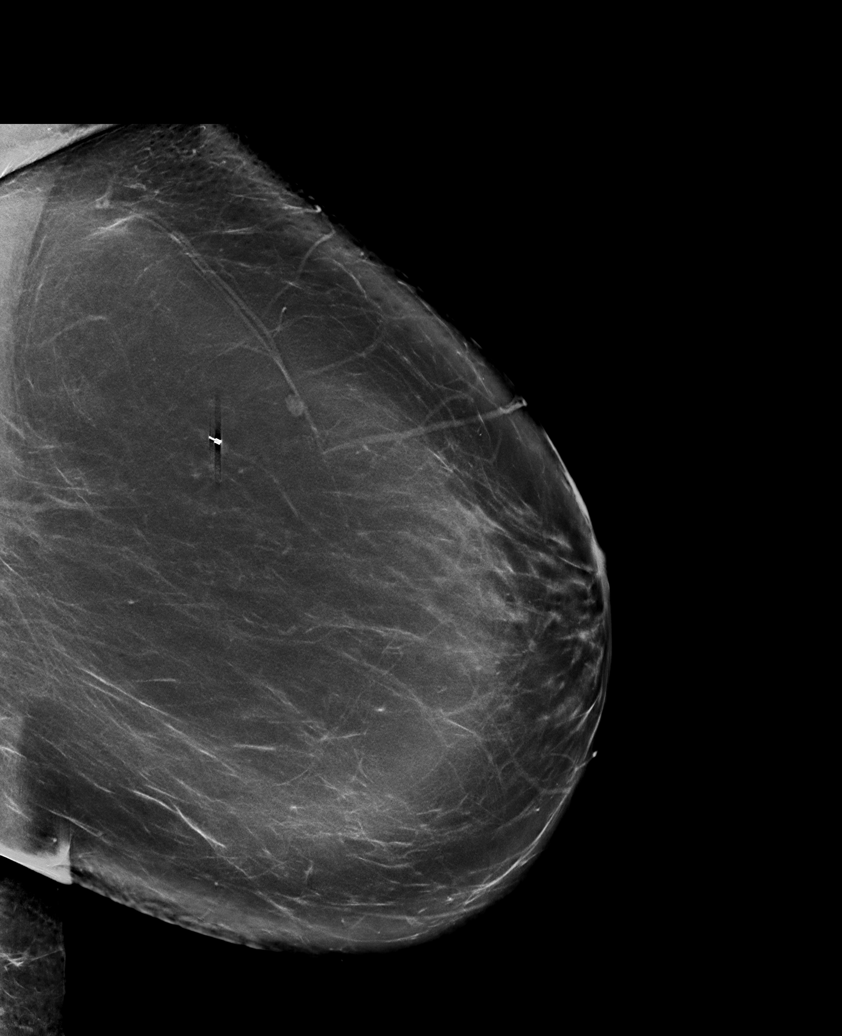

[R CC synth-2D]
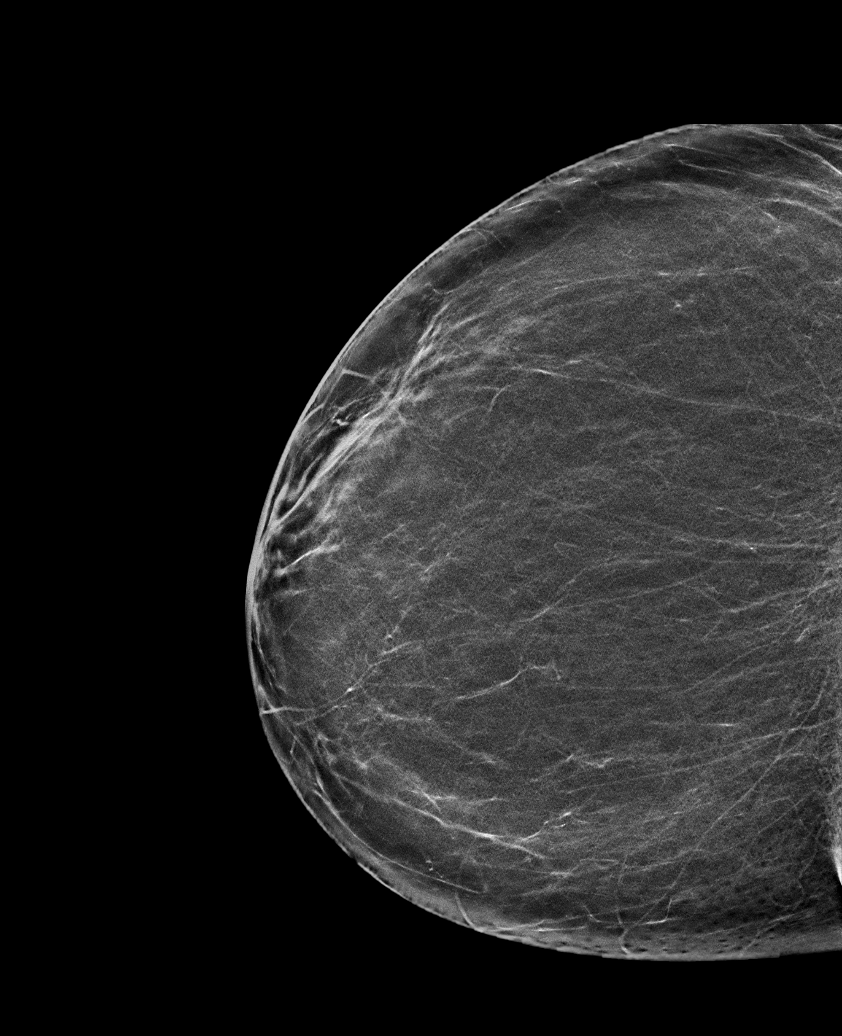

[L MLO tomo · tomo slice 53/104.0]
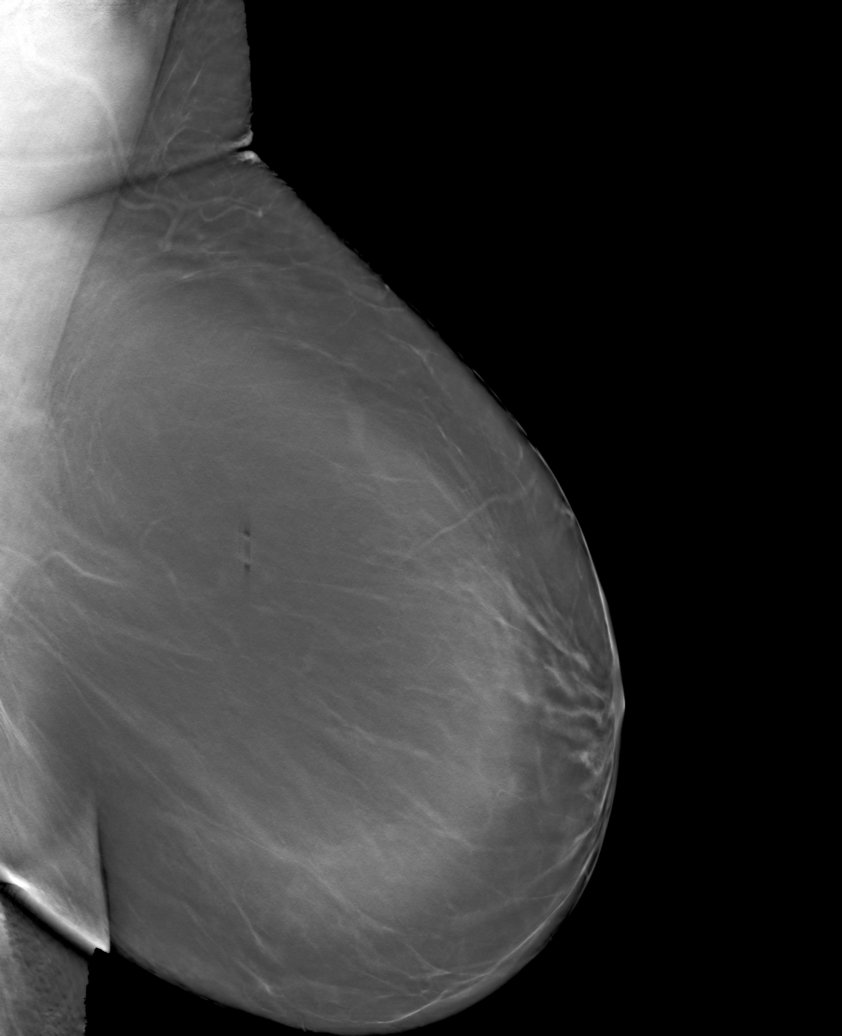

[6 of 30 positions shown; findings below may reference images not displayed]

ACR Breast Density Category b: There are scattered areas of
fibroglandular density.
FINDINGS: There are no findings suspicious for malignancy.
IMPRESSION: No mammographic evidence of malignancy. A result letter of this
screening mammogram will be mailed directly to the patient.

RECOMMENDATION:
Screening mammogram in one year. (Code:51-O-LD2)

BI-RADS CATEGORY  1: Negative.

## 2023-04-10 ENCOUNTER — Ambulatory Visit (INDEPENDENT_AMBULATORY_CARE_PROVIDER_SITE_OTHER): Payer: BC Managed Care – PPO | Admitting: Gastroenterology

## 2023-04-13 ENCOUNTER — Ambulatory Visit (INDEPENDENT_AMBULATORY_CARE_PROVIDER_SITE_OTHER): Payer: BC Managed Care – PPO | Admitting: Gastroenterology

## 2023-05-13 ENCOUNTER — Other Ambulatory Visit (INDEPENDENT_AMBULATORY_CARE_PROVIDER_SITE_OTHER): Payer: Self-pay | Admitting: Gastroenterology

## 2023-11-29 ENCOUNTER — Encounter (INDEPENDENT_AMBULATORY_CARE_PROVIDER_SITE_OTHER): Payer: Self-pay | Admitting: Gastroenterology
